# Patient Record
Sex: Female | Born: 1970 | Race: White | Hispanic: No | Marital: Single | Smoking: Never smoker
Health system: Southern US, Community
[De-identification: ages and names within clinical notes are randomized; demographics above are authoritative.]

## PROBLEM LIST (undated history)

## (undated) DIAGNOSIS — K219 Gastro-esophageal reflux disease without esophagitis: Secondary | ICD-10-CM

## (undated) DIAGNOSIS — J309 Allergic rhinitis, unspecified: Secondary | ICD-10-CM

## (undated) HISTORY — PX: KNEE SURGERY: SHX244

## (undated) HISTORY — PX: IUD REMOVAL: SHX5392

## (undated) HISTORY — DX: Allergic rhinitis, unspecified: J30.9

## (undated) HISTORY — DX: Gastro-esophageal reflux disease without esophagitis: K21.9

## (undated) HISTORY — PX: OTHER SURGICAL HISTORY: SHX169

---

## 2008-08-29 DIAGNOSIS — E7800 Pure hypercholesterolemia, unspecified: Secondary | ICD-10-CM | POA: Insufficient documentation

## 2008-08-29 DIAGNOSIS — J309 Allergic rhinitis, unspecified: Secondary | ICD-10-CM | POA: Insufficient documentation

## 2009-02-09 ENCOUNTER — Ambulatory Visit: Payer: Self-pay | Admitting: Primary Care

## 2009-08-29 ENCOUNTER — Encounter: Payer: Self-pay | Admitting: Gastroenterology

## 2009-08-29 ENCOUNTER — Ambulatory Visit: Payer: Self-pay | Admitting: Primary Care

## 2009-08-29 NOTE — Progress Notes (Signed)
Reason For Visit   Jasmine Nunez is here for evaluation of possible sinus infection.  VSwain, MA.  HPI   Jasmine Nunez is a 39 year old woman who c/o headache and yellow nasal discharge.    Feels fatigued.  Tried Allegra w/o relifef.  Sympotms for 1 week.  H/o   sinusitis but not for a while.  Both of her children have been sick with   cold symptoms.  Allergies   Latex-asked/denied  No Known Drug Allergy.  Current Meds   ** Medication reconciliation completed. **.  Multiple Vitamin Tablet;TAKE 1 TABLET DAILY.; RPT  Fish Oil 1000 MG Capsule;TAKE 1 CAPSULE DAILY.; RPT.  Active Problems   Allergic Rhinitis (477.9)  Essential Hypercholesterolemia (272.0).  Vital Signs   Recorded by vswain on 29 Aug 2009 10:11 AM  Temp: 98.3 F.  Physical Exam   General: Well-nourished well-developed in no acute distress.  HEENT: Oropharynx shows cobblestoning without exudate or tonsillar   enlargement.  Both tympanic membranes show normal landmarks.  No sinus   tenderness to palpation Neck: No anterior cervical lymphadenopathy.  Lungs: Clear to auscultation bilaterally.  Orders   Fluticasone Propionate 50 MCG/ACT Suspension;USE 2 SPRAYS IN EACH NOSTRIL   ONCE DAILY; Qty1; R11; Rx.  Amoxicillin 875 MG Tablet;TAKE 1 TABLET EVERY 12 HOURS DAILY; Qty20; R0; Rx.  Plan   Postnasal drip with sinusitis: Prescribed Flonase for treatment of the   postnasal drip which also will hopefully help the sinusitis.  Also advised   to try nasal saline.  If no improvement in the next couple of days, the   patient can fill the prescription provided for amoxicillin.  Signature   Electronically signed by: Inocencio Homes  MD Attend.; 08/29/2009 12:32   PM EST; Chartered loss adjuster.

## 2009-10-15 ENCOUNTER — Ambulatory Visit: Payer: Self-pay | Admitting: Primary Care

## 2009-10-15 ENCOUNTER — Ambulatory Visit
Admit: 2009-10-15 | Discharge: 2009-10-15 | Disposition: A | Discharge status: Home / Self Care | Payer: Self-pay | Source: Ambulatory Visit | Attending: Primary Care | Admitting: Primary Care

## 2009-10-15 LAB — CBC AND DIFFERENTIAL
Baso # K/uL: 0.1 THOU/uL (ref 0.0–0.1)
Basophil %: 0.6 % (ref 0.1–1.2)
Eos # K/uL: 0.2 THOU/uL (ref 0.0–0.4)
Eosinophil %: 2.5 % (ref 0.7–5.8)
Hematocrit: 42 % (ref 34–45)
Hemoglobin: 13.8 g/dL (ref 11.2–15.7)
Lymph # K/uL: 3.3 THOU/uL (ref 1.2–3.7)
Lymphocyte %: 37.3 % (ref 19.3–51.7)
MCV: 84 fL (ref 79–95)
Mono # K/uL: 0.6 THOU/uL (ref 0.2–0.9)
Monocyte %: 6.5 % (ref 4.7–12.5)
Neut # K/uL: 4.7 THOU/uL (ref 1.6–6.1)
Platelets: 258 THOU/uL (ref 160–370)
RBC: 5 MIL/uL (ref 3.9–5.2)
RDW: 13.8 % (ref 11.7–14.4)
Seg Neut %: 53.1 % (ref 34.0–71.1)
WBC: 8.9 THOU/uL (ref 4.0–10.0)

## 2009-10-15 LAB — COMPREHENSIVE METABOLIC PANEL
ALT: 16 U/L (ref 0–35)
AST: 28 U/L (ref 0–35)
Albumin: 4.7 g/dL (ref 3.5–5.2)
Alk Phos: 78 U/L (ref 35–105)
Anion Gap: 9 (ref 7–16)
Bilirubin,Total: 0.3 mg/dL (ref 0.0–1.2)
CO2: 29 mmol/L — ABNORMAL HIGH (ref 20–28)
Calcium: 9.5 mg/dL (ref 8.8–10.2)
Chloride: 101 mmol/L (ref 96–108)
Creatinine: 0.65 mg/dL (ref 0.51–0.95)
GFR,Black: >59 *
GFR,Caucasian: >59 *
Glucose: 98 mg/dL (ref 74–106)
Lab: 17 mg/dL (ref 6–20)
Potassium: 4.9 mmol/L (ref 3.3–5.1)
Sodium: 139 mmol/L (ref 133–145)
Total Protein: 7.6 g/dL (ref 6.3–7.7)

## 2009-10-15 LAB — IGA: IgA: 211 mg/dL (ref 70–400)

## 2009-10-15 LAB — TSH: TSH: 1.54 u[IU]/mL (ref 0.27–4.20)

## 2009-10-15 NOTE — Progress Notes (Signed)
Reason For Visit   Jasmine Nunez is here for a follow up visit.  VSwain, MA.  HPI   Jasmine Nunez is a 39 yo woman who comes in today with c/o feeling achy, headaches   and CP.     She says she feels achy all over.  Sleeps  8 hours and doesn't feel rested.    At times, she feels like she has a normal amt of energy.  She is usually a   very active person.  Knees and feet feel achy.  Has had this before, but a   little more frequent.  No rash.  No swelling of the joints.  Hasn't noticed   it being worse in the AM.  Takes Advil which helps.     Has been getting bad headaches.  Dull pain.  Vision feels funny along with   slight nausea.  H/A has lasted for 3 days.  Took Excedrin Migraine which   helped.  Rarely gets headaches.  Her left eye was twitching for weeks.       Has had pain and heaviness in her chest and heaviness in her left arm.  Not   assoc'd with stress.  Mom has CAD.  Usually lasts several hours.  No GERD   symptoms.  Not assoc'd with activity.  No symptoms with cardio exercise.       doesn't feel depressed but doesn't have energy to get anything done and   feels overwhelmed.  Sex drive has been decreased since her daughter was   born.  She and her husband have been going to counseling since December for   some help with managing arguments.  things are much better between them.  Allergies   Latex-asked/denied  No Known Drug Allergy.  Current Meds   ** Medication reconciliation completed and patient declined printed list.   **.  Multiple Vitamin Tablet;TAKE 1 TABLET DAILY.; RPT  Fish Oil 1000 MG Capsule;TAKE 1 CAPSULE DAILY.; RPT  Fluticasone Propionate 50 MCG/ACT Suspension;USE 2 SPRAYS IN EACH NOSTRIL   ONCE DAILY; Rx.  Active Problems   Allergic Rhinitis (477.9)  Essential Hypercholesterolemia (272.0).  POCT   EKG:  NSR rate 78.  No acute ST T wave changes.  Normal intervals.  Vital Signs   Recorded by vswain on 15 Oct 2009 02:51 PM  BP:112/58,  RUE,  Sitting,   Weight: 133 lb.  Physical Exam   GENERAL APPEARANCE:  Appears stated age, well appearing, NAD     LUNGS: Clear to auscultation   HEART: Normal S1,S2 without murmurs  ABDOMEN: NABS, soft, non-tender, without hepato-splenomegaly  EXTREMITIES: Without clubbing, cyanosis, or edema  NEUROLOGIC: Alert and oriented x3.  Orders   Omeprazole 20 MG Capsule Delayed Release;take 1 capsule by mouth once   daily; Qty30; R0; Rx.  Plan   CP:  likely GERD or anxeity, but will order stress echo given FH of CAD.    Rx omeprazole to help with possible reflux     Fatigue, polyarthralgia:  check labs     Headache:  consistent with migraines.  Discussed importance of stress   reduction.  Advised to take Excedrin Migraine at first sign of headache     LABS needed today:   --CBC    --Chem 14    --TSH    Other: celiac screen, RF, ANA, Vit D  FOLLOW UP:   6  Weeks   .  Signature   Electronically signed by: Inocencio Homes  MD Attend.; 10/15/2009 4:09  PM EST; Chartered loss adjuster.

## 2009-10-16 LAB — RHEUMATOID FACTOR,SCREEN: Rheumatoid Factor: <10 IU/mL

## 2009-10-16 LAB — ANTINUCLEAR ANTIBODY SCREEN: ANA Screen: NEGATIVE

## 2009-10-16 LAB — LYME IGG/IGM AB: Lyme AB Screen: NEGATIVE

## 2009-10-16 LAB — T4, FREE: Free T4: 1.2 ng/dL (ref 0.9–1.7)

## 2009-10-17 LAB — TISSUE TRANSGLUT,IGA: tTG,IgA: 7.8 AB/Units (ref 0.0–19.9)

## 2009-10-18 LAB — VITAMIN D
25-OH VIT D2: <4 ng/mL
25-OH VIT D3: 36 ng/mL
25-OH Vit Total: 36 ng/mL (ref 30–80)

## 2009-10-24 ENCOUNTER — Other Ambulatory Visit: Payer: Self-pay

## 2009-11-01 ENCOUNTER — Other Ambulatory Visit: Payer: Self-pay | Admitting: Gastroenterology

## 2009-11-01 ENCOUNTER — Ambulatory Visit: Payer: Self-pay

## 2009-12-12 ENCOUNTER — Ambulatory Visit
Admit: 2009-12-12 | Discharge: 2009-12-12 | Disposition: A | Discharge status: Home / Self Care | Payer: Self-pay | Source: Ambulatory Visit | Attending: Primary Care | Admitting: Primary Care

## 2009-12-12 LAB — LIPID PANEL
Chol/HDL Ratio: 2.8
Cholesterol: 193 mg/dL
HDL: 69 mg/dL
LDL Calculated: 106 mg/dL
Non HDL Cholesterol: 124 mg/dL
Triglycerides: 88 mg/dL

## 2009-12-12 LAB — ALT: ALT: 19 U/L (ref 0–35)

## 2009-12-12 LAB — AST: AST: 25 U/L (ref 0–35)

## 2009-12-19 ENCOUNTER — Ambulatory Visit: Payer: Self-pay | Admitting: Primary Care

## 2009-12-19 NOTE — Progress Notes (Signed)
Reason For Visit   Jasmine Nunez is here for a follow up visit.  VSwain, MA.  HPI   Jasmine Nunez is a 39 year old woman who comes in today for her achiness and chest   pain.     At the last visit, I ordered a stress echocardiogram which was normal.  She   still gets pain in left side of chest.  Omeprazole has really helped, but   she did run out.  She did get pain when she was on the omeprazole.    Appetite is good.  No blood in stool.  Sometimes has epigastric pain.    Feels a choking sensation.       Achiness and fatigue has improved for the most part.  Her level of anxiety   has improved as well since her husband has been treated for ADHD.  She   finds that he is much easier to communicate with since he has been treated.  Allergies   Latex-asked/denied  No Known Drug Allergy.  Current Meds   ** Medication reconciliation completed and patient declined printed list.   **.  Multiple Vitamin Tablet;TAKE 1 TABLET DAILY.; RPT  Fish Oil 1000 MG Capsule;TAKE 1 CAPSULE DAILY.; RPT  Loratadine 10 MG Tablet;TAKE 1 TABLET DAILY AS NEEDED.; RPT.  Active Problems   Allergic Rhinitis (477.9)  Essential Hypercholesterolemia (272.0).  Vital Signs   Recorded by vswain on 19 Dec 2009 08:50 AM  BP:98/60,  RUE,  Sitting,   Height: 62 in, Weight: 134 lb, BMI: 24.5 kg/m2.  Physical Exam   General: Well-nourished well-developed in no acute distress.  Abdomen: Soft, nontender, nondistended.  Orders   Renew Omeprazole 20 MG Capsule Delayed Release;take 1 capsule by mouth once   daily; Qty30; R3; Rx.  Plan   Dyspepsia: This is persistent despite the omeprazole.  Refill the   omeprazole, but referred her to do Dr. Aquilla Hacker from gastroenterology for an   endoscopy.     Assessment: Gave patient flu vaccine.  Signature   Electronically signed by: Inocencio Homes  MD Attend.; 12/19/2009 10:54   AM EST; Chartered loss adjuster.

## 2010-03-08 ENCOUNTER — Ambulatory Visit
Admit: 2010-03-08 | Discharge: 2010-03-08 | Disposition: A | Discharge status: Home / Self Care | Payer: Self-pay | Source: Ambulatory Visit | Attending: Obstetrics | Admitting: Obstetrics

## 2010-03-08 LAB — COMPREHENSIVE METABOLIC PANEL
ALT: 19 U/L (ref 0–35)
AST: 25 U/L (ref 0–35)
Albumin: 4.3 g/dL (ref 3.5–5.2)
Alk Phos: 68 U/L (ref 35–105)
Anion Gap: 9 (ref 7–16)
Bilirubin,Total: 0.3 mg/dL (ref 0.0–1.2)
CO2: 28 mmol/L (ref 20–28)
Calcium: 9 mg/dL (ref 8.8–10.2)
Chloride: 102 mmol/L (ref 96–108)
Creatinine: 0.53 mg/dL (ref 0.51–0.95)
GFR,Black: >59 *
GFR,Caucasian: >59 *
Glucose: 63 mg/dL — ABNORMAL LOW (ref 74–106)
Lab: 15 mg/dL (ref 6–20)
Potassium: 4 mmol/L (ref 3.3–5.1)
Sodium: 139 mmol/L (ref 133–145)
Total Protein: 6.8 g/dL (ref 6.3–7.7)

## 2010-03-08 LAB — CBC AND DIFFERENTIAL
Baso # K/uL: 0 THOU/uL (ref 0.0–0.1)
Basophil %: 0.4 % (ref 0.1–1.2)
Eos # K/uL: 0.2 THOU/uL (ref 0.0–0.4)
Eosinophil %: 2.2 % (ref 0.7–5.8)
Hematocrit: 40 % (ref 34–45)
Hemoglobin: 13 g/dL (ref 11.2–15.7)
Lymph # K/uL: 2.6 THOU/uL (ref 1.2–3.7)
Lymphocyte %: 33.5 % (ref 19.3–51.7)
MCV: 84 fL (ref 79–95)
Mono # K/uL: 0.6 THOU/uL (ref 0.2–0.9)
Monocyte %: 7.8 % (ref 4.7–12.5)
Neut # K/uL: 4.4 THOU/uL (ref 1.6–6.1)
Platelets: 224 THOU/uL (ref 160–370)
RBC: 4.7 MIL/uL (ref 3.9–5.2)
RDW: 13.4 % (ref 11.7–14.4)
Seg Neut %: 56.1 % (ref 34.0–71.1)
WBC: 7.8 THOU/uL (ref 4.0–10.0)

## 2010-03-08 LAB — TSH: TSH: 1.62 u[IU]/mL (ref 0.27–4.20)

## 2010-03-08 LAB — PROLACTIN: Prolactin: 5.5 ng/mL

## 2010-03-08 LAB — LUTEINIZING HORMONE: LH: 7.3 m[IU]/mL

## 2010-03-08 LAB — ESTRADIOL: Estradiol: 88 pg/mL

## 2010-03-08 LAB — FOLLICLE STIMULATING HORMONE: FSH: 2.9 m[IU]/mL

## 2010-03-11 DIAGNOSIS — E559 Vitamin D deficiency, unspecified: Secondary | ICD-10-CM | POA: Insufficient documentation

## 2010-03-11 LAB — VITAMIN D
25-OH VIT D2: <4 ng/mL
25-OH VIT D3: 25 ng/mL
25-OH Vit Total: 25 ng/mL — ABNORMAL LOW (ref 30–80)

## 2010-04-09 NOTE — Miscellaneous (Unsigned)
 Continuity of Care Record  Created: todo  From: ,   From:   From: [employer], EHR v10.2.7.53  To: Steven, Haylyn  Purpose: Patient Use;       Problems  Diagnosis: Allergic Rhinitis (477.9)   Diagnosis: Essential Hypercholesterolemia (272.0)   Diagnosis: Vitamin D Deficiency 11 Mar 2010 (268.9)     Family History  Maternal history of Coronary Artery Disease    Alerts  Allergy - Latex-asked/denied   Allergy - No Known Drug Allergy     Medications  Loratadine 10 MG Tablet; TAKE 1 TABLET DAILY AS NEEDED. ; RPT   Multiple Vitamin Tablet; TAKE 1 TABLET DAILY. ; RPT   Ofloxacin 0.3 % Solution; INSTILL 1 DROP 4 TIMES DAILY into affected eye   for 3 to 5 days ; Rx   Omeprazole 20 MG Capsule Delayed Release; take one capsule by mouth every   day ; Rx     Immunizations  Tdap (Adacel)   Influenza   Influenza

## 2010-06-03 ENCOUNTER — Ambulatory Visit: Payer: Self-pay | Admitting: Primary Care

## 2010-06-03 ENCOUNTER — Encounter: Payer: Self-pay | Admitting: Primary Care

## 2010-06-03 NOTE — Progress Notes (Signed)
 Reason For Visit   C/o poss pink eye and sinus infection  ehandslpn.  HPI   40 year old woman who wears contact lenses presents for irritation of the   left eye.  Patient reports a probable conjunctivitis.  She has not been using any over-the-counter drops.  He has had mucoid   discharge.  She denies any eye pain or visual disturbances although she   reports some mild blurring of the vision..  This may be secondary to her   secretions.  No preceding viral illness.   She is currently wearing glasses and has stopped wearing her contacts.   .  Allergies   Latex-asked/denied  No Known Drug Allergy.  Current Meds   ** Medication reconciliation completed and updated list printed for the   patient. **.  Multiple Vitamin Tablet;TAKE 1 TABLET DAILY.; RPT  Loratadine 10 MG Tablet;TAKE 1 TABLET DAILY AS NEEDED.; RPT  Omeprazole 20 MG Capsule Delayed Release;take 1 capsule by mouth once   daily; Rx.  Active Problems   Allergic Rhinitis (477.9)  Essential Hypercholesterolemia (272.0)  Vitamin D Deficiency 11 Mar 2010 (268.9).  Personal Hx   Medications reviewed in [employer]   .  Vital Signs   Recorded by [facility] on 03 Jun 2010 09:38 AM  BP:100/60,   HR: 86 b/min,   Temp: 98.6 F,   Height: 62 in, Weight: 135 lb, BMI: 24.7 kg/m2.  Physical Exam   GENERAL APPEARANCE: no acute distress  HEENT: left eye with conjunctival injection.  No proptosis.  Pupils   reactive.  Funduscopic exam is normal.  Extraocular muscles intact.  Right   eye is normal.  LUNGS: Clear to auscultation   HEART: regular rhythm  .  Assessment   1. Conjunctivitis, acute OS   - Rx ofloxacin solution as directed  - see eye care professional of no improvement in 48 hours  - no contacts until symptoms resolve.  Signature   Electronically signed by: Royden Purl  D.O.; 06/03/2010 10:15 AM EST.

## 2010-06-04 ENCOUNTER — Ambulatory Visit: Payer: Self-pay | Admitting: Primary Care

## 2010-06-04 ENCOUNTER — Ambulatory Visit: Payer: Self-pay | Admitting: Ophthalmology

## 2010-06-07 ENCOUNTER — Ambulatory Visit: Payer: Self-pay | Admitting: Ophthalmology

## 2010-06-24 ENCOUNTER — Ambulatory Visit: Payer: Self-pay | Admitting: Ophthalmology

## 2010-06-27 ENCOUNTER — Ambulatory Visit
Admit: 2010-06-27 | Discharge: 2010-06-27 | Disposition: A | Discharge status: Home / Self Care | Payer: Self-pay | Source: Ambulatory Visit | Attending: Gastroenterology | Admitting: Gastroenterology

## 2010-06-27 LAB — IGA: IgA: 199 mg/dL (ref 70–400)

## 2010-06-28 LAB — TISSUE TRANSGLUT,IGA: tTG,IgA: 4.9 AB/Units (ref 0.0–19.9)

## 2010-07-15 ENCOUNTER — Other Ambulatory Visit: Payer: Self-pay | Admitting: Obstetrics & Gynecology

## 2010-07-15 DIAGNOSIS — R928 Other abnormal and inconclusive findings on diagnostic imaging of breast: Secondary | ICD-10-CM

## 2010-07-15 DIAGNOSIS — N63 Unspecified lump in unspecified breast: Secondary | ICD-10-CM

## 2010-07-18 ENCOUNTER — Ambulatory Visit
Admit: 2010-07-18 | Discharge: 2010-07-18 | Disposition: A | Discharge status: Home / Self Care | Payer: Self-pay | Source: Ambulatory Visit | Attending: Obstetrics & Gynecology | Admitting: Obstetrics & Gynecology

## 2010-10-16 ENCOUNTER — Ambulatory Visit: Payer: Self-pay | Admitting: Primary Care

## 2010-10-16 VITALS — BP 100/62 | HR 64 | Temp 98.8°F | Wt 132.4 lb

## 2010-10-16 DIAGNOSIS — R059 Cough, unspecified: Secondary | ICD-10-CM

## 2010-10-16 DIAGNOSIS — J309 Allergic rhinitis, unspecified: Secondary | ICD-10-CM

## 2010-10-16 MED ORDER — GUAIFENESIN-CODEINE 100-10 MG/5ML PO SYRP *I*
5.0000 mL | ORAL_SOLUTION | Freq: Three times a day (TID) | ORAL | Status: DC | PRN
Start: 2010-10-16 — End: 2010-10-25

## 2010-10-16 NOTE — Progress Notes (Signed)
Subjective:     Patient ID: Jasmine Nunez is a 40 y.o. female.    HPI  Jasmine Nunez is a 40 yo woman who c/o cough and congestion.  Lost voice 2 weeks ago.  Coughing for past 2 weeks. Tried zyrtec.  Feels really tired throughout the day.  Still pretty congested.  Using a nasal steroid with a little relief.      Patient's medications, allergies, past medical, surgical, social and family histories were reviewed and updated as appropriate.    Review of Systems          Objective:   Physical Exam   Constitutional: She appears well-developed and well-nourished. She appears ill.   HENT:   Right Ear: Tympanic membrane normal.   Left Ear: Tympanic membrane normal.   Nose: Mucosal edema present.   Mouth/Throat: No oropharyngeal exudate.   Pulmonary/Chest: Breath sounds normal.   Lymphadenopathy:     She has no cervical adenopathy.             Assessment:      Sinusitis:  Likely viral with persistent cough        Plan:        rx guiafenesin with codeine to help suppress cough  Advised to use nasal saline

## 2010-10-23 ENCOUNTER — Telehealth: Payer: Self-pay | Admitting: Primary Care

## 2010-10-23 NOTE — Telephone Encounter (Signed)
Spoke with patient.    States that she has been having a headache on/off in the morning for the past 3-4 days.  She states that she has been waking with tension at the base of her neck... Seems to be spreading to her head and eventually to the left side of her head and left eye.  She states that she has been taking Excedrin migraine #2 pills in morning.  She states that she feels fine throughout the day but then wakes again with this "awful headache."  She states that this morning she developed dizziness and nausea.  She states that she has never been prescribed anything for migraines before.  We discussed that she could possibly be having rebound headaches from increased use of Excedrin.  She will try Ibuprofen 600mg  at bedtime tonight and follow up tomorrow if no improvement.  Advised her to take this with food and increase hydration.  Also discussed with patient that she could try taking Ibuprofen 600mg  TID tomorrow if needed to to help with headache and if she wasn't feeling better on Friday she should call in the morning for an appointment.  She is aware she can call on-call physician if needed.

## 2010-10-23 NOTE — Telephone Encounter (Signed)
PT CALLING HAS A VERY BAD MIGRAINE  LIKE TO SPEAK WITH A NURSE ABOUT IT.  PLZ CALL HER ASAP  THNAK YOU

## 2010-10-25 ENCOUNTER — Encounter: Payer: Self-pay | Admitting: Primary Care

## 2010-10-25 ENCOUNTER — Ambulatory Visit: Payer: Self-pay | Admitting: Primary Care

## 2010-10-25 ENCOUNTER — Telehealth: Payer: Self-pay | Admitting: Primary Care

## 2010-10-25 DIAGNOSIS — J309 Allergic rhinitis, unspecified: Secondary | ICD-10-CM

## 2010-10-25 DIAGNOSIS — G43719 Chronic migraine without aura, intractable, without status migrainosus: Secondary | ICD-10-CM | POA: Insufficient documentation

## 2010-10-25 DIAGNOSIS — G43909 Migraine, unspecified, not intractable, without status migrainosus: Secondary | ICD-10-CM

## 2010-10-25 LAB — CBC AND DIFFERENTIAL
Baso # K/uL: 0 10*3/uL (ref 0.0–0.1)
Basophil %: 0.1 % (ref 0.1–1.2)
Eos # K/uL: 0 10*3/uL (ref 0.0–0.4)
Eosinophil %: 0.1 % — ABNORMAL LOW (ref 0.7–5.8)
Hematocrit: 43 % (ref 34–45)
Hemoglobin: 14.5 g/dL (ref 11.2–15.7)
Lymph # K/uL: 1.3 10*3/uL (ref 1.2–3.7)
Lymphocyte %: 9.6 % — ABNORMAL LOW (ref 19.3–51.7)
MCV: 81 fL (ref 79–95)
Mono # K/uL: 0.2 10*3/uL (ref 0.2–0.9)
Monocyte %: 1.7 % — ABNORMAL LOW (ref 4.7–12.5)
Neut # K/uL: 12.1 10*3/uL — ABNORMAL HIGH (ref 1.6–6.1)
Platelets: 280 10*3/uL (ref 160–370)
RBC: 5.3 MIL/uL — ABNORMAL HIGH (ref 3.9–5.2)
RDW: 13.3 % (ref 11.7–14.4)
Seg Neut %: 88.5 % — ABNORMAL HIGH (ref 34.0–71.1)
WBC: 13.6 10*3/uL — ABNORMAL HIGH (ref 4.0–10.0)

## 2010-10-25 LAB — BASIC METABOLIC PANEL
Anion Gap: 11 (ref 7–16)
CO2: 26 mmol/L (ref 20–28)
Calcium: 9.6 mg/dL (ref 8.8–10.2)
Chloride: 101 mmol/L (ref 96–108)
Creatinine: 0.55 mg/dL (ref 0.51–0.95)
GFR,Black: >59 *
GFR,Caucasian: >59 *
Glucose: 128 mg/dL — ABNORMAL HIGH (ref 60–99)
Lab: 16 mg/dL (ref 6–20)
Potassium: 4.2 mmol/L (ref 3.3–5.1)
Sodium: 138 mmol/L (ref 133–145)

## 2010-10-25 LAB — T4, FREE: Free T4: 1.3 ng/dL (ref 0.9–1.7)

## 2010-10-25 LAB — TSH: TSH: 0.62 u[IU]/mL (ref 0.27–4.20)

## 2010-10-25 MED ORDER — AMITRIPTYLINE HCL 10 MG PO TABS *I*
10.0000 mg | ORAL_TABLET | Freq: Every evening | ORAL | Status: DC
Start: 2010-10-25 — End: 2010-10-29

## 2010-10-25 MED ORDER — METHYLPREDNISOLONE 4 MG PO TABS *I*
ORAL_TABLET | ORAL | Status: DC
Start: 2010-10-25 — End: 2010-10-31

## 2010-10-25 MED ORDER — EPINEPHRINE 0.3 MG/0.3ML IJ DEVI *I*
0.3000 mg | Freq: Once | INTRAMUSCULAR | Status: AC
Start: 2010-10-25 — End: 2010-10-25

## 2010-10-25 NOTE — Progress Notes (Signed)
Subjective:     Patient ID: Jasmine Nunez is a 40 y.o. female.    HPI  Jasmine Nunez is a 40 year old woman who comes in today with the complaint of a persistent headache and allergic reaction.  Developed sudden trouble with abd pain and diarrhea last night.  Happened about an hour after eating dinner.  Dinner was ribs, corn, melon.  Had oatmeal and homemade sweet tea- made with mint and lemon.  She had been drinking this batch of tea for the past few days.  Within an hour, she had developed a very red face, hive on eye.  Took 4 tsps of benadryl and started rx for prednisone last night.  Benadryl was effective immediately for itching in throat.     Has had a headache for the past 3 days.  Has been feeling nauseous and dizzy all week.  Has been feeling sound and light sensitive.  Has been having more headaches over past 6-8 months.  Happening 4-5 times per month.  H/a is at the back of the head or over the left eye.  Left eye twitches when this happens.  She's never really had migraines before.  For birth control, she uses an IUD which does not contain hormones        Jasmine Nunez has Essential Hypercholesterolemia; Allergic Rhinitis; Vitamin D Deficiency; and Migraine on her problem list.  Current Outpatient Prescriptions on File Prior to Visit   Medication Sig Dispense Refill   . FISH OIL Take 1 capsule by mouth daily as needed           . cholecalciferol (VITAMIN D) 1000 UNIT tablet Take 1,000 Units by mouth daily           . cetirizine (ZYRTEC) 10 MG tablet Take 10 mg by mouth daily           . omeprazole (PRILOSEC) 20 MG capsule take one capsule by mouth every day  30  1   . Multiple Vitamin tablet TAKE 1 TABLET DAILY.    0     Jasmine Nunez is allergic to no known drug allergy and no known latex allergy.            Review of Systems          Objective: BP 120/82  Pulse 91  Wt 60.51 kg (133 lb 6.4 oz)     Physical Exam   Constitutional: She is oriented to person, place, and time. She appears well-developed and well-nourished. She  appears distressed (mild).   HENT:   Head: Normocephalic and atraumatic.   Right Ear: Tympanic membrane normal.   Left Ear: Tympanic membrane normal.   Mouth/Throat: Oropharynx is clear and moist.   Cardiovascular: Normal rate, regular rhythm, S1 normal and S2 normal.    No murmur heard.  Pulmonary/Chest: Effort normal and breath sounds normal.   Abdominal: Soft. There is no tenderness.   Neurological: She is alert and oriented to person, place, and time. She has normal strength and normal reflexes. No cranial nerve deficit or sensory deficit.             Assessment:      Allergic reaction: This mostly likely represents a food allergy.  She is clinically improved at this point    Headache: Most likely a tension headache, but does have features of a migraine headache given the photo and phonophobia.  This is a new onset headache.        Plan:    the plan is  for referral to allergy to determine which foods she is allergic to.  Provided patient with a prescription for an EpiPen should the symptoms recur.  Also advised her to take Benadryl in the event that symptoms recur as well as prednisone.    Ordered a head CT to rule out an intracranial mass in this new-onset headache.  Prescribed amitriptyline to help prevent headaches.  Discussed common triggers for migraine including certain foods, stress, insomnia.  Followup in 6 weeks to reassess.    Orders Placed This Encounter   Procedures   . CT head with contrast   . CT head without contrast   . T4, free   . TSH   . Basic metabolic panel   . CBC and differential   . AMB REFERRAL TO ALLERGY

## 2010-10-25 NOTE — Telephone Encounter (Signed)
Left patient detailed message, asked them to call back if they have any questions.

## 2010-10-25 NOTE — Telephone Encounter (Signed)
Message copied by Lennie Hummer on Fri Oct 25, 2010  1:18 PM  ------       Message from: Inocencio Homes       Created: Fri Oct 25, 2010  1:16 PM         route

## 2010-10-28 ENCOUNTER — Telehealth: Payer: Self-pay | Admitting: Primary Care

## 2010-10-28 NOTE — Telephone Encounter (Signed)
Left patient detailed message, asked them to call back if they have any questions.

## 2010-10-28 NOTE — Telephone Encounter (Signed)
Message copied by Lennie Hummer on Mon Oct 28, 2010  9:28 AM  ------       Message from: Inocencio Homes       Created: Fri Oct 25, 2010  6:06 PM         Please let her know the CT was normal

## 2010-10-29 ENCOUNTER — Encounter: Payer: Self-pay | Admitting: Sleep Medicine

## 2010-10-29 ENCOUNTER — Ambulatory Visit: Payer: Self-pay | Admitting: Sleep Medicine

## 2010-10-29 VITALS — BP 112/65 | HR 86 | Temp 98.4°F | Ht 62.0 in | Wt 132.0 lb

## 2010-10-29 DIAGNOSIS — J309 Allergic rhinitis, unspecified: Secondary | ICD-10-CM

## 2010-10-29 DIAGNOSIS — Z91018 Allergy to other foods: Secondary | ICD-10-CM

## 2010-10-29 DIAGNOSIS — T782XXA Anaphylactic shock, unspecified, initial encounter: Secondary | ICD-10-CM

## 2010-10-29 DIAGNOSIS — T781XXA Other adverse food reactions, not elsewhere classified, initial encounter: Secondary | ICD-10-CM

## 2010-10-29 MED ORDER — BECLOMETHASONE DIPROPIONATE 80 MCG/ACT IN AERS *I*
INHALATION_SPRAY | RESPIRATORY_TRACT | Status: DC
Start: 2010-10-29 — End: 2011-02-14

## 2010-10-29 NOTE — Patient Instructions (Signed)
-   AVOID all melons (canteloupe, honeydew, watermelon, muskmelon, etc).     - Make sure you have your EpiPen present at all times to use in the event of a severe allergic reaction (hives all over, itchy throat with throat tightening or swelling, vomiting/diarrhea, abdominal pain, trouble breathing or speaking). If you have another reaction like you had last week, use the EpiPen and call 911 right away.     - For the allergy skin testing, bring the BBQ sauce and the canteloupe so we can test you to those.     - For the nasal and eye symptoms, and the postnasal drip, try saline rinses 2-3 times per week, and when you do the rinses, do them before starting the nasal Qvar.     - Start Qvar 80, 1 puff in each nostril twice a day, using a baby bottle nipple with the tip cut off as a nasal applicator.     - Avoid OTC antihistamines (Allegra, Claritin, Benadryl, Zyrtec, etc) 4 full days before the skin testing appointment.

## 2010-10-29 NOTE — Progress Notes (Signed)
Dear Dr. Porfirio Mylar,    Thank you very much for requesting a consultation for your patient,  Jasmine Nunez, who presented to the [facility] for a new patient evaluation today.  As you know, Jasmine Nunez is a 40 y.o. female with a past medical history significant for GERD, migraine headaches, and allergic rhinitis. She presents for evaluation for a possible food allergy in the setting of a recent episode of anaphylaxis. Five days ago, Jasmine Nunez states about an hour after she had dinner, while she was putting her son to bed, she developed severe abdominal cramping and pain. She ran to the bathroom, and began to experience generalized itching that progressed to oral itching and throat tightening. She had diarrhea and noticed hives on her face, and her entire face and neck were very red. She also felt short of breath. She spoke to the on-call physician for her practice who recommended she take 50 mg of Benadryl, and called in a prescription for methylprednisolone. The symptoms gradually abated and she fell asleep, and awoke the next morning feeling much better, though she states her "throat still felt funny for a while" the next day. She now carries an EpiPen at all times and has almost finished the methylprednisolone taper.     About one hour prior to the onset of these symptoms, Jasmine Nunez had eaten dinner consisting of BBQ ribs, corn on the cob (steamed with butter), and muskmelon (English canteloupe) for desert. She reports none of these foods were unusual for her, and there were no other unusual ingestions (no new medications or other foods) or exposures. Of note, she does report she has had isolated oral itching with bananas and watermelon in the past, and once experienced abdominal pain after eating watermelon.     Jasmine Nunez has seasonal and perennial allergy symptoms, particularly in the spring and fall. She has never had a formal allergy evaluation, and manages her  symptoms with cetirizine as needed. She has used Rhinocort in the past, but did not like the liquid spray or the smell of the spray. She does not have a history of asthma or related symptoms. She has no known trouble with stinging insects, and no known medication allergies. She has not had any recurrent bacterial or fungal infections, infections of deep tissue or infections caused by unusual organisms.     Past Med Hx:  Past Medical History   Diagnosis Date   . GERD (gastroesophageal reflux disease)    . History of migraine headaches    . Allergic rhinitis      Past Surg Hx:   Past Surgical History   Procedure Date   . Converted procedure      [facility] Intrauterine Device (IUD) Removal Conversion Data    . Knee surgery 1986     for benign bony overgrowth      ROS:  Except as per HPI, 10 other systems were reviewed and were normal.    Fam Hx:  Family History   Problem Relation Age of Onset   . Conversion Other      [id number] Artery [id number]^Active^mid 50s   . Asthma Mother    . Asthma Sister    . Allergic rhinitis Sister    . No other contributory family history.     Environmental Hx:   Jasmine Nunez lives with her husband and two children (ages 2 and 4). They have no pets. They live in a house that  has mostly wall-to-wall carpeting. There are no known problems with mold or water damage in the home. She does not smoke, and drinks 1 alcoholic drink per week, and denies illicit drug use. She is a stay-at-home mom.      Meds:  Reviewed separately and discussed with the patient.    Physical Examination:  GEN: Alert and interactive, well-nourished, well appearing, NAD  VITALS: Reviewed separately  HEENT: A complete HEENT examination was done with details as follows: TMs pearly gray with normal landmarks, PERRL, EOMI, conjunctivae pink, sclerae with moderate injection, nasal mucosa is moderately edematous with enlarged inferior turbinates and narrowed nasal passages, oropharynx with cobblestoning and  visible mucous drainage  NECK: Supple with full ROM, no thyromegaly, no appreciable lymphadenopathy  LUNGS: Clear to auscultation bilaterally  HEART: Normal S1,S2 without murmurs  EXT: Without clubbing, cyanosis, or edema  SKIN: Clear, no lesions or rashes noted    Assessment:  Subrena Kobrin Crothers is a 40 y.o. female with likely seasonal and perennial allergic rhinoconjunctivitis who had a recent episode of anaphylaxis of unclear etiology. 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 In many individuals who have environmental allergies, fresh fruits and vegetables and certain nuts and spices can trigger an allergic reaction that causes oropharyngeal itching and tingling. In some of these individuals, pollen-food allergy syndrome , also called oral allergy syndrome, can cause itching and even swelling of the throat. This is due to cross-reactivity among some of the proteins that compose aeroallergens and some proteins in fruits and vegatables, so that the body reacts to both. In particular, individuals who are allergic to ragweed may also react to melons and bananas. Fewer than 10 percent of patients with allergies to fresh fruits and vegetables experience systemic symptoms, but in a review that included a total of 1361 individuals with PFAS, 1.7 percent experienced anaphylaxis to the fruit or vegetable, and 9% experience systemic symptoms outside of the GI tract. 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     Skin testing is not usually performed for several weeks after an episode of anaphylaxis because it has been observed that anaphylaxis can render the skin temporarily nonreactive. Thus, we will plan to have Mrs. Free return to our  office for allergy skin testing to the BBQ sauce, corn, beef or pork, and melons, as well as to environmental allergens, in the next 5-6 weeks.     Recommendations:  - For her ocular and nasal symptoms, Mrs. Arber will start Qvar 80 intranasally, since she expresses a dislike for the taste and sensation of a liquid nasal spray. The way to do this is to place a baby bottle nipple over the mouth piece of the Qvar (the very tip of the baby bottle nipple should be cut off to create a larger opening). Then 1 puff is applied in each nostril twice daily through the baby bottle nipple.     - She will also start nasal saline rinses 2-3 times per week, before applying the nasal Qvar.    - She will avoid melons entirely, and will carry the EpiPen at all times. The appropriate use and administration of self-injectable epinephrine was reviewed with Mrs. Kunka, and she  was able to demonstrate how  to administer the EpiPen appropriately using an EpiPen trainer device.     - I want to thank you again for the consultation. We will plan on following up in 6 weeks, after the skin testing, or sooner if needed. It was a pleasure seeing Mrs. Vetere today. If you have any questions or concerns, please do not hesitate to contact me at [phone].

## 2010-10-30 ENCOUNTER — Telehealth: Payer: Self-pay | Admitting: Primary Care

## 2010-10-30 DIAGNOSIS — R42 Dizziness and giddiness: Secondary | ICD-10-CM

## 2010-10-30 MED ORDER — MECLIZINE HCL 25 MG PO TABS *I*
25.0000 mg | ORAL_TABLET | Freq: Three times a day (TID) | ORAL | Status: DC | PRN
Start: 2010-10-30 — End: 2010-12-18

## 2010-10-30 NOTE — Telephone Encounter (Signed)
Patient is having a terrible bout of dizziness/nausea, it did subside but has started again. She was given a new inhaler yesterday, but the first episode started prior starting that. She is wondering could this be from the steroid she is using. (she took the last dose this morning). Being taking for the last 6 days. Please call her to advise.

## 2010-10-30 NOTE — Telephone Encounter (Signed)
Spoke to paitnet.  Upset stomahc since eating pizza 2 days ago.  Started to feel dizzy yest.  Very dizzy today and nauseous.  Had neg head CT last week.  Saw allergist and started nasal spray yest  Wondering if nasal spray has given her vertigo  rx meclizine.  Warned may make her fatigued  Referred to neuro given dizziness and new onset headaches  Advised to f/u if sx;s persist in 2 days

## 2010-10-31 ENCOUNTER — Ambulatory Visit: Payer: Self-pay | Admitting: Palliative Care

## 2010-10-31 ENCOUNTER — Telehealth: Payer: Self-pay

## 2010-10-31 ENCOUNTER — Telehealth: Payer: Self-pay | Admitting: Primary Care

## 2010-10-31 VITALS — BP 126/90 | HR 113 | Wt 132.0 lb

## 2010-10-31 DIAGNOSIS — D72829 Elevated white blood cell count, unspecified: Secondary | ICD-10-CM

## 2010-10-31 DIAGNOSIS — H832X9 Labyrinthine dysfunction, unspecified ear: Secondary | ICD-10-CM

## 2010-10-31 MED ORDER — METHYLPREDNISOLONE 4 MG PO TABS *I*
ORAL_TABLET | ORAL | Status: DC
Start: 2010-10-31 — End: 2010-12-04

## 2010-10-31 NOTE — Telephone Encounter (Signed)
Patient is coming in to see Dr. Marjo Bicker today at 3:40pm

## 2010-10-31 NOTE — Progress Notes (Signed)
Subjective:   Patient comes in for dizziness.    She was feeling her usual state of health until 1 week ago when she had a sudden onset allergic reaction with lip, facial and throat swelling, diffuse erythema of the skin, and hives.  She was started on benadryl and methylprednisolone, which helped.  2 days later, she starting have dizziness, like she might pass out, but also unsteady.  This has persisted but got worse yesterday.  Called and was prescribed meclizine yesterday by Dr. Porfirio Mylar.  It didn't seem to help much, so hasn't taken anything else in about 18 hours.    Today still with ongoing dizzy feeling:  Unsteady "like I'm drunk". Can't walk in a straight line, feels like she's in slow motion, speech feels slow, has to be careful with movements.   No headache.  Had CT negative last week.  No visual changes, hearing changes, ringing in ears, vertigo/spinning.  No numbness, tingling, weakness.  No fevers/chills.  Stomach feels "off", grumbling but no diarrhea.  Regular BM today.  No n/v.  +Viral infection last month with sinusitis  No family h/o vertigo, hypothyroidism, MS, cancer.     Patient Active Problem List   Diagnoses Code   . Essential Hypercholesterolemia 272.0   . Allergic Rhinitis 477.9   . Vitamin D Deficiency 268.9   . Migraine 346.90     Medications: reviewed medication, made appropriate updates, list printed for patient at discharge  Current Outpatient Prescriptions on File Prior to Visit   Medication Sig Dispense Refill   . epinephrine (EPIPEN) 0.3 MG/0.3ML DEVI Inject 0.3 mg into the muscle once as needed           . beclomethasone (QVAR) 80 MCG/ACT inhaler Shake well. Use 1 puff each nostril twice daily using a baby bottle nipple.  1 Inhaler  5   . FISH OIL Take 1 capsule by mouth daily as needed           . cholecalciferol (VITAMIN D) 1000 UNIT tablet Take 1,000 Units by mouth daily           . cetirizine (ZYRTEC) 10 MG tablet Take 10 mg by mouth daily           . omeprazole (PRILOSEC) 20  MG capsule take one capsule by mouth every day  30  1   . Multiple Vitamin tablet TAKE 1 TABLET DAILY.    0   . meclizine (ANTIVERT) 25 MG tablet Take 1 tablet (25 mg total) by mouth 3 times daily as needed    30 tablet  1     Allergies:   Allergies   Allergen Reactions   . No Known Drug Allergy    . No Known Latex Allergy      Review of Systems:   Constitutional: negative for fevers, chills, fatigue, or unintentional weight loss  Eyes: negative for visual disturbance and irritation.  Ears, nose, mouth, throat, and face: negative for ear drainage, earaches, hearing loss, nasal congestion and sore throat  Respiratory: negative for wheezing, cough, or dyspnea.  Cardiovascular: negative for chest pain, exertional chest pressure/discomfort, fatigue, lower extremity edema, palpitations and poor exercise tolerance.  Gastrointestinal:   negative for abdominal pain, change in bowel habits or melena.  Genitourinary:  negative for decreased stream, dysuria and frequency.  Hematologic/lymphatic: negative for bleeding, easy bruising and lymphadenopathy  Musculoskeletal:  negative for arthralgias, muscle weakness and myalgias  Neurological:+ coordination problems, no headaches, seizures, speech problems and weakness.  Behavioral/Psych:  negative for abusive relationship, anxiety, depression, and sleep disturbance.  Allergic/Immunologic: negative for allergic reactions    Vitals:  Filed Vitals:    10/31/10 1545   BP: 126/90   Pulse: 113   Weight: 59.875 kg (132 lb)     Exam:  General appearance: alert, cooperative and mild distress, appears flushed and anxious  Oropharynx: lips, mucosa, and tongue normal; teeth and gums normal  Neck:  Normal thyroid, no cervical or supraclavicular LAD, no masses  TMs normal, canals clear.  Lungs: clear to auscultation bilaterally  Heart: regular rate and rhythm, S1, S2 normal, no murmur, click, rub or gallop, pulse 96 manually counted  Abdomen: soft, non-tender; bowel sounds normal; no masses,  no  organomegaly  Extremities: extremities normal, atraumatic, no cyanosis or edema   Neurologic: Alert and oriented X 3, normal strength and tone. PERRL.  Normal symmetric reflexes 2+.  Tandem gait a little unsteady but able to walk straight line.  Rhomberg abnormal (sways to the left without any touching/nudging).  Finger to nose and heel-to-shin normal.  Mental status: affect: slightly anxious but normal     Assessment and Plan:    1.  Vestibular dysfunction:  Reassuring that she had normal CT for acute bleed, tumor.    Most likely vestibular problem rather than cerebellar problem based on exam.  Link between recent unexplained anaphylactic reaction is unclear, though inner ear fluid is possible.  Referred to urgent ENT, called and appt given 8:30am tomorrow.  Check CBC (f/u of elevated WBC).  May use meclizine prn.  Rx for renewed methylprednisolone.  Slight possibility of MS, so if not improving, would consider MRI.

## 2010-10-31 NOTE — Telephone Encounter (Signed)
Your patient has been scheduled on October 10 @ 1PM With GENERAL NEUROLOGY AT [facility]  .  Please notify your patient that they will receive a packet in the mail 2 weeks prior to the appointment that will include info and directions to our office.  If your patient cannot make this appointment, has questions or concerns please have them call the office directly at [phone].    Please be aware that your patient may likely require authorizations from their insurance company [employer]  for appointments with our office.          AMB REFERRAL TO NEUROLOGY (Order [id number])          ----- Message -----     From: Milus Height     Sent: 10/31/2010  10:18 AM       To: Alric Seton Staff  Subject: Order for Jasmine Nunez                            Patient Name: Jasmine Nunez([id number])  Sex: Female  DOB: 02-23-1971      PCP: Inocencio Homes    Center: None     Types of orders made on 10/30/2010: Medications, Outpatient Referral    Order Date:10/30/2010  Ordering User:CICILLINE, MICHELLE [[id number]]  Encounter Provider:Cicilline, Marcelino Duster, MD [phone]  Authorizing Provider: Inocencio Homes, MD [[id number]]  Department:[facility][[id number]]    Order Specific Information  Order: AMB REFERRAL TO NEUROLOGY [Custom: [id number]]  Order #: [id number]  Qty: 1    Priority: Routine  Class: Internal Referral    Associated Diagnoses      780.4 Dizziness      REASON FOR CONSULT: -> Dizziness         AUTH # NEEDED? -> NO         TRACK REFERRAL? -> Yes         PATIENT WILL CALL YOUR OFFICE TO SCHEDULE APPT? -> No         PLEASE CALL PATIENT TO SCHEDULE APPOINTMENT: -> Yes         SCHEDULE TO FIRST AVAILABLE PROVIDER? -> Yes           Priority: Routine  Class: Internal Referral      Scheduling Instructions:    What is your clinical question for the Neurologist? Dizziness, headaches    Associated Diagnoses      780.4 Dizziness      REASON FOR CONSULT: -> Dizziness         AUTH # NEEDED? -> NO         TRACK REFERRAL? -> Yes          PATIENT WILL CALL YOUR OFFICE TO SCHEDULE APPT? -> No         PLEASE CALL PATIENT TO SCHEDULE APPOINTMENT: -> Yes         SCHEDULE TO FIRST AVAILABLE PROVIDER? -> Yes          Referral Associated with Order         Referral ID Date Referral Status Decision Date     [id number] 10/31/2010 New Request 10/31/10                             Referred By Referral Type Referral Reason     Inocencio Homes Referral Consult and treat  To Location/POS To Provider To Specialty     none none Neurology                         Priority Visits Requested Expiration Date     Routine 1             To Department:    To Dept Specialty: Neurology         Order Information                Date Ordering/Authorizing Department     10/30/2010 Inocencio Homes, MD [facility]                   Order Providers         Authorizing Provider Encounter Provider     Inocencio Homes, MD Inocencio Homes, MD                               Associated Diagnoses      Dizziness                 Scheduling Instructions      What is your clinical question for the Neurologist? Dizziness, headaches           Order Questions         Question Answer Comment     REASON FOR CONSULT: Dizziness       AUTH # NEEDED? NO       TRACK REFERRAL? Yes       PATIENT WILL CALL YOUR OFFICE TO SCHEDULE APPT? No       PLEASE CALL PATIENT TO SCHEDULE APPOINTMENT: Yes       SCHEDULE TO FIRST AVAILABLE PROVIDER? Yes                   Detailed Information      Priority and Order Details             [employer] Encounter

## 2010-10-31 NOTE — Telephone Encounter (Signed)
Please call Branae she is still having a lot of dizziness and she is not feeling very well

## 2010-11-01 ENCOUNTER — Ambulatory Visit: Payer: Self-pay | Admitting: Otolaryngology

## 2010-11-01 ENCOUNTER — Encounter: Payer: Self-pay | Admitting: Otolaryngology

## 2010-11-01 VITALS — BP 114/71 | HR 119 | Ht 62.0 in | Wt 136.0 lb

## 2010-11-01 DIAGNOSIS — R42 Dizziness and giddiness: Secondary | ICD-10-CM

## 2010-11-01 NOTE — Telephone Encounter (Signed)
I didn't have a chance to  Call today. Could you call on Monday?

## 2010-11-01 NOTE — Telephone Encounter (Signed)
Dr. Porfirio Mylar,  Can you try to get her Neurology appointment moved up.  She came in to see Dr. Marjo Bicker yesterday and we got her in to the [facility] this morning.  They thought her symptoms were more migraine related.  She is having a hard time with the dizziness especially with taking care of her kids.  TY

## 2010-11-01 NOTE — Progress Notes (Signed)
Subjective:       Jasmine Nunez is a 40 y.o. female who presents with concerns of dizziness.  This began on 10/29/10.  She has had previous dizziness episodes that self resolve. Denies vertigo. She has been having increasing headaches over past 6-8 months. Happening 4-5 times per month. Headaches are located at the back of the head or over the left eye. Left eye twitches when this happens. She becomes photo/phonophobic. She felt drunk and had to focus to move her muscles in a coordinated fashion yesterday. She felt difficulty forming words when trying to speak that lasted for a few hours.  Presently complaining of nausea, left maxillary/ethmoid/left eye pressure. For birth control, she uses an IUD which does not contain hormones.    She started Qvar and stopped within the last week.  After having a significant allergic reaction she was prescribed a prednisone dose pack. She was prescribed amitriptyline over the last week and has not started to take it yet.    CT scan head on 10/25/10 was negative for masses, hemorrhages, or lesions.  Both parents with a history of anxiety disorder.    Social history:  She lives with her husband and two children (ages 2 and 4). They have no pets. They live in a house that has mostly wall-to-wall carpeting. There are no known problems with mold or water damage in the home. She does not smoke, and drinks 1 alcoholic drink per week (does not drink wine), and denies illicit drug use. 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    She denies otalgia, tinnitus, aural fullness, fluctuating hearing loss, loud noise triggering sx, family history of ear diseases, past psychiatric dx, past neurologic dx, history of head trauma with LOC, heart palpitations, headaches, weakness/tingling of the body, facial asymmetry, dehydration, blacking out, loss of vision, presyncope/syncope, excessive thirst/urination, thyroid dx, new herbal supplements    History:       has a  past medical history of GERD (gastroesophageal reflux disease); History of migraine headaches; and Allergic rhinitis.   has past surgical history that includes converted procedure and knee surgery (1986).   reports that she has never smoked. She does not have any smokeless tobacco history on file. She reports that she drinks alcohol. She reports that she does not use illicit drugs.  family history includes Allergic rhinitis in her sister; Asthma in her mother and sister; Cancer in her maternal grandmother; Conversion in an other family member; Diabetes in her maternal grandmother and mother; and Heart failure in her maternal grandmother and mother.    Allergies:   Review of patient's allergies indicates no known allergies.     Medications:     Current Outpatient Prescriptions   Medication Sig   . amitriptyline (ELAVIL) 10 MG tablet    . epinephrine (EPIPEN) 0.3 MG/0.3ML DEVI Inject 0.3 mg into the muscle once as needed       . FISH OIL Take 1 capsule by mouth daily as needed       . cholecalciferol (VITAMIN D) 1000 UNIT tablet Take 1,000 Units by mouth daily       . omeprazole (PRILOSEC) 20 MG capsule take one capsule by mouth every day   . Multiple Vitamin tablet TAKE 1 TABLET DAILY.   Marland Kitchen CHERATUSSIN AC 100-10 MG/5ML liquid    . predniSONE (DELTASONE) 20 MG tablet    . methylPREDNISolone (MEDROL) 4 MG tablet Taper as directed   . meclizine (  ANTIVERT) 25 MG tablet Take 1 tablet (25 mg total) by mouth 3 times daily as needed     . beclomethasone (QVAR) 80 MCG/ACT inhaler Shake well. Use 1 puff each nostril twice daily using a baby bottle nipple.   . cetirizine (ZYRTEC) 10 MG tablet Take 10 mg by mouth daily           Review of Systems:     Dizziness, blurry vision, heart burn, muscle aches, watery itchy eyes, ringing in the ears, stuffy nose, dry mouth, hoarse voice, upset stomach, decreased energy, daytime sleepiness. All other ENT ROS otherwise negative.      Objective:     Orthostatic blood pressure:    Laying:   106/67  Sitting:  112/69  Stand:   115/70     BP 114/71  Pulse 119  Ht 1.575 m (5\' 2" )  Wt 61.689 kg (136 lb)  BMI 24.87 kg/m2    General:   Normocephalic, well nourished, with appropriate affect in NAD.  A &O x 3.    NEURO:  CN II-XII grossly intact.  Normal cerebellar function tests with heel to shin, finger to nose.  Normal ULE/LLE strength and symmetry.  Negative Dix Hallpike bilaterally.  Normal Halmagyi headthrust test.  Negative Fukuda.  Negative Romberg.       CARDIAC:  RRR, no bruits auscultated in neck bilaterally     Head and Face:  The head and face reveal normal facial symmetry without lesions or scars. The salivary glands are palpated and appear normal without tenderness or mass.    EYES:  Non icteric, normal conjunctiva.  EOMI.   Ears: RIGHT EAR:  Ear pinna is normal in appearance with no scars, lesions or masses. Mastoid bone is non tender. The ear canal is clear.  The tympanic membrane is intact without inflammation or perforation. TM is mobile to pneumatic otoscopy without evidence of middle ear effusion. Using 512 Hz tuning fork AC> BC and normal Weber performed on the bony nasal dorsum.     LEFT EAR:  Ear pinna is normal in appearance with no scars, lesions or masses. Mastoid bone is non tender. The ear canal is clear. The tympanic membrane is intact without inflammation or perforation. TM is mobile to pneumatic otoscopy without evidence of middle ear effusion. Using 512 Hz tuning fork AC> BC and normal Weber performed on the bony nasal dorsum.   Nose:    External nose is normal. The nasal mucosa is not inflamed. The nasal septum is generally midline and there is no evidence of septal perforation or hematoma. The inferior turbinates are normal without masses or obstructions. No polyps are visualized. The paranasal sinuses are non tender.   Oral:   ORAL CAVITY: The lips, teeth, tongue and buccal mucosa appear normal without lesions or inflammation.  The uvula and soft palate are normal.  The  tonsils are non erythematous or swollen.  Oropharynx is without lesion, inflammation or swelling.   Neck:  There is no asymmetry or cervical lymphadenopathy noted in either anterior or posterior neck chains or supraclavicular fossa bilaterally. There are no evident masses, trachea is midline and the thyroid gland reveals no enlargement, tenderness nor mass effect.        Assessment:       40 y.o. female with dizziness     Plan:     Her dizziness is not likely vestibular in origin.  I am suspicious her dizziness is associated with her new onset of headaches that sound  migrainous.  I gave her information about migraine headaches with ways to find/avoid migraine triggers. She has an appointment scheduled with a neurologist.  Follow up with ENT as needed.  It has been a pleasure participating in the care of your patient. Thank you for your referral to [facility].  If you have any questions, please feel free to contact me.

## 2010-11-04 NOTE — Telephone Encounter (Signed)
Jasmine Nunez,  Patient is scheduled to see Dr. Lance Muss at Neurology on 01/08/11.  Can you see if we can get her in sooner?

## 2010-11-04 NOTE — Telephone Encounter (Signed)
I CALLED THEY SAID THAT UNFORTUNATELY THEY DO NOT HAVE ANY SOONER APPOINTMENTS HOWEVER I CAN CALL BACK AND SAY THAT DR. CICILLINE WOULD LIKE TO PAGE THE DR. IN THE "TRIAGE ROOM" AND THEY CAN TALK AND IF THEY BOTH DECIDE SHE NEEDS TO BE SEEN SOONER THEY CAN GET HER IN

## 2010-11-05 NOTE — Telephone Encounter (Signed)
Spoke with Joyanna let her know date of appt and also faxed over information to dr. Ellwood Dense office

## 2010-11-05 NOTE — Telephone Encounter (Signed)
Can you call Dr. Vernona Rieger and see if he can get her in sooner?

## 2010-11-05 NOTE — Telephone Encounter (Signed)
lmtcb set Brithney up with dr. Marylynn Pearson [phone] address:[address] [place] [address]  Date: Thursday 8/30 @ 9:15 ...  I Need to fax over pertinent information

## 2010-11-19 ENCOUNTER — Other Ambulatory Visit: Payer: Self-pay | Admitting: Primary Care

## 2010-11-19 DIAGNOSIS — K219 Gastro-esophageal reflux disease without esophagitis: Secondary | ICD-10-CM

## 2010-11-20 MED ORDER — OMEPRAZOLE 20 MG PO CPDR *I*
DELAYED_RELEASE_CAPSULE | ORAL | Status: DC
Start: 2010-11-19 — End: 2011-05-16

## 2010-12-02 ENCOUNTER — Encounter: Payer: Self-pay | Admitting: Gastroenterology

## 2010-12-04 ENCOUNTER — Ambulatory Visit: Payer: Self-pay | Admitting: Sleep Medicine

## 2010-12-04 ENCOUNTER — Ambulatory Visit: Payer: Self-pay

## 2010-12-04 ENCOUNTER — Encounter: Payer: Self-pay | Admitting: Sleep Medicine

## 2010-12-04 DIAGNOSIS — T781XXA Other adverse food reactions, not elsewhere classified, initial encounter: Secondary | ICD-10-CM

## 2010-12-04 DIAGNOSIS — J3089 Other allergic rhinitis: Secondary | ICD-10-CM

## 2010-12-04 MED ORDER — OLOPATADINE HCL 0.1 % OP SOLN *I*
1.0000 [drp] | Freq: Two times a day (BID) | OPHTHALMIC | Status: DC | PRN
Start: 2010-12-04 — End: 2010-12-18

## 2010-12-04 NOTE — Progress Notes (Signed)
Dear Dr. Porfirio Mylar,    This letter is to inform you that our mutual patient,  Jasmine Nunez, was seen at the [facility] for a follow-up evaluation today.  As you know, Mrs. Kizer is a 40 y.o. female with a past medical history significant for migraine headaches, allergic rhinoconjunctivitis, GERD, and pollen food allergy syndrome whom I last saw in July of this year. Since our last visit, she  has been doing fairly well. Thankfully, Mrs. Detwiler has not had any further severe allergic reactions to any foods. She has continued to avoid all melons completely, but has recently eaten corn, beef, pork, and bananas without any adverse reactions. She has an EpiPen with her at all times and is comfortable with the appropriate use and administration of the EpiPen. At our last visit, I recommended that Mrs. Wilton start nasal saline rinses 2 - 3 times per week, as well as intranasal Qvar. She reports that she has not yet started the saline rinses, but she is using the Qvar, and has found it to be helpful in relieving her congestion and postnasal drip. She is taking loratadine 10 mg daily as well. Despite these medications, she reports a significant increase in her nasal congestion, sneezing, and itchy/watery eyes as of the last few weeks. She presents today for allergy skin testing to environmental allergens, as well as to corn, watermelon, canteloupe, and BBQ sauce (same brand as she had eaten the night she had the allergic reaction).     I have reviewed and confirmed the details of the past medical history, surgical history, immunization history, family history, and environmental history as previously detailed in the patient's chart.      ROS:  Except as per HPI, 6 other systems were reviewed and were normal.       Meds:  Reviewed and discussed with the patient.      Physical Examination:  GEN: Alert and interactive, well-nourished, well appearing, NAD  VITALS: Reviewed  separately  HEENT: A complete HEENT examination was done with details as follows: TMs pearly gray with normal landmarks, PERRL, EOMI, conjunctivae pink, sclerae with moderate injection, infraorbital hyperpigmentation bilaterally, nasal mucosa is moderately edematous with enlarged inferior turbinates and narrowed nasal passages, oropharynx with cobblestoning and visible mucous drainage  NECK: Supple with full ROM, no thyromegaly, no appreciable lymphadenopathy  LUNGS: Clear to auscultation bilaterally  HEART: Normal S1,S2 without murmurs  EXT: Without clubbing, cyanosis, or edema  SKIN: Clear, no lesions or rashes noted    Results:  Skin tests -   Environmental Positives: cats, dogs, spring pollens (sugar maple, red cedar, willow), summer grasses (grass mix, smooth brome, yellow dock), and fall pollens (artemesia, cocklebur, kochia, and ragweed)  Environmental Negatives: house dust mites, molds  Food Positives: slight reactivity to corn (not significant enough to qualify as a positive reaction, and patient reports she has regularly been eating foods with corn with no adverse reactions)  Food Negatives: watermelon, canteloupe (both with commercially prepared extracts as well as fresh fruit), fresh mint leaves, BBQ sauce    Assessment:  Jasmine Nunez is a 40 y.o. 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 PFAS is an example of cross-reactivity. Proteins in fruits and vegetables cause the reaction because they're similar to those allergy-causing proteins found in certain pollens. For example, individuals  with ragweed allergy will often react to melons and bananas. Most of the time, allergy skin testing to the cross-reactive fruits is negative, as was the case for Mrs. Dhondt. The vast majority of people with PFAS experience isolated oral itching or  tingling when they eat the culprit fruit or vegetable, but in rare cases, severe allergic reactions can occur. This is the most likely case with Mrs. Reish. She has extensive environmental allergies, and continues to be quite symptomatic despite reported adherence to allergy medications. As I explained to Mrs. Grunewald, she may stand to benefit more from allergy immunotherapy than from medications alone given the extent of her allergies.      Recommendations:  - Continue loratadine (Claritin) 10 mg daily, and she can certainly take 10 mg twice a day. If she finds that the twice-daily loratadine isn't helping, she will try fexofenadine (Allegra) 180 mg by mouth once a day.     - Try either nasal saline rinses, or alternatively, she will just use a saline spray in each side of her nose as often as she feels she needs to.     - Continue the nasal Qvar 1 puff in each naris using a baby bottle nipple with the tip cut off, twice a day every day.     - For itchy/watery eyes, she can use Patanol (olopatadine) 1 drop in each eye twice a day as needed. These drops are safe to use with contact lenses.     - Mrs. Frutiger will continue to avoid all melons (canteloupe, honeydew, watermelon, muskmelon, etc.), and make sure she has an EpiPen available at all times to use in the event of a severe allergic reaction. The appropriate use and administration of self-injectable epinephrine was reviewed with Mrs. Selvage again today.    - The risks and benefits of allergy immunotherapy were discussed at length with Mrs. Villescas, and she  was given information on allergy immunotherapy to read at home at her convenience.     - I want to thank you for allowing me to participate in Mrs. Sabet's care. We will plan on following up in 6 months, or sooner if needed. It was a pleasure seeing her  today. If you have any questions or concerns, please do not hesitate to contact me.    Sincerely,  Nadara Eaton, MD

## 2010-12-04 NOTE — Patient Instructions (Signed)
- Jasmine Nunez, you are allergic to cats, dogs, spring pollens (sugar maple, red cedar, willow), summer grasses (grass mix, smooth brome, yellow dock), and fall pollens (artemesia, cocklebur, kochia, and ragweed).     - Continue loratadine (Claritin) 10 mg daily, and you can certainly take 10 mg twice a day. If you find that it isn't helping, try fexofenadine (Allegra) 180 mg by mouth once a day.     - Try either nasal saline rinses, or alternatively, just use a saline spray in each side of your nose as often as you feel you need to. This will keep the inside of your nose moist and will help thin out mucus. If you do the rinses, or use the saline spray, do so before applying the nasal Qvar, so that you spray the Qvar onto a clean nasal mucosa and it will be more effective.     - Continue the nasal Qvar 1 puff in each side of your nose twice a day every day.     - For itchy/watery eyes, you can use Patanol (olopatadine) 1 drop in each eye twice a day as needed. Place the drops in your eyes approximately 10 minutes before putting in your contacts.     - Avoid all melons (canteloupe, honeydew, watermelon, muskmelon, etc.), and make sure you have an EpiPen available at all times to use in the event of a severe allergic reaction.     - Let's plan to follow-up in 6 months, or sooner if needed. If you have any questions or concerns by all means don't hesitate to contact me! Dr. Berna Spare.     Pollen-food allergy syndrome  In many people who have hay fever, fresh fruits and vegetables and certain nuts and spices can trigger an allergic reaction that causes the mouth to tingle or itch. In some people, pollen-food allergy syndrome -- sometimes called oral allergy syndrome -- can cause itching and even swelling of the throat. This is an example of cross-reactivity. Proteins in fruits and vegetables cause the reaction because they're similar to those allergy-causing proteins found in certain pollens. For example, if you're allergic to  ragweed, you may also react to melons; if you're allergic to birch pollen, you may also react to apples. Cooking fruits and vegetables can help you avoid this reaction. Most cooked fruits and vegetables generally don't cause cross-reactive oral allergy symptoms.     Common cross-reactivity between pollens and fruits and vegetables:   If you are allergic to [place] pollen Ragweed pollen Grasses Mugwort pollen   You may also have a reaction to: Apples  Carrots  Celery  Hazelnuts  Peaches  Plums  Pears  Raw potatoes Bananas  Melons  (cantaloupe, honeydew and watermelon) Tomatoes   Tomatoes  Oranges    Apples  Carrots  Celery  Kiwi fruit  Peanuts  Some spices (caraway seeds, parsley, coriander, anise seeds, fennel seeds)     The most frequent reaction involves itchiness or swelling of the mouth, face, lip, tongue and throat. Symptoms usually appear immediately after eating raw fruits or vegetables, although the reaction can occur more than an hour later.    Rarely, OAS can cause severe throat swelling or anaphylaxis in a person who is highly allergic.    Allergy Shots (Immunotherapy)    Allergen immunotherapy, also known as allergy shots, is a form of long-term treatment that decreases symptoms for many people with allergic rhinitis, allergic asthma, conjunctivitis (eye allergy) or stinging insect allergy.  Allergy shots decrease sensitivity  to allergens and often leads to lasting relief of allergy symptoms even after treatment is stopped. This makes it a cost-effective, beneficial treatment approach for many people.    Who Can Benefit From Allergy Shots?  Both children and adults can receive allergy shots, although it is not typically recommended for children under age 78. This is because of the difficulties younger children may have in cooperating with the program and in articulating any adverse symptoms they may be experiencing. When considering allergy shots for an older adult, medical conditions such as cardiac  disease should be taken into consideration and discussed with your allergist / immunologist first.  You and your allergist / immunologist should base your decision regarding allergy shots on:  . Length of allergy season and severity of your symptoms   . How well medications and/or environmental controls are helping your allergy symptoms   . Your desire to avoid long-term medication use   . Time available for treatment (allergy shots requires a significant commitment)  . Cost, which may vary depending on region and insurance coverage  Allergy shots are not used to treat food allergies. The best option for people with food allergies is to strictly avoid that food.    How Do Allergy Shots Work?  Allergy shots work like a vaccine. Your body responds to injected amounts of a particular allergen, given in gradually increasing doses, by developing immunity or tolerance to the allergen.  There are two phases:  . Build-up phase. This involves receiving injections with increasing amounts of the allergens about one to two times per week. The length of this phase depends upon how often the injections are received, but generally ranges from three to six months.   . Maintenance phase. This begins once the effective dose is reached. The effective maintenance dose depends on your level of allergen sensitivity and your response to the build-up phase. During the maintenance phase, there will be longer periods of time between treatments, ranging from two to four weeks. Your allergist / immunologist will decide what range is best for you.  You may notice a decrease in symptoms during the build-up phase, but it may take as long as 12 months on the maintenance dose to notice an improvement. If allergy shots are successful, maintenance treatment is generally continued for three to five years. Any decision to stop allergy shots should be discussed with your allergist / immunologist.     How Effective Are Allergy Shots?  Allergy shots have  shown to decrease symptoms of many allergies. It can prevent the development of new allergies, and in children it can prevent the progression of allergic disease from allergic rhinitis to asthma. The effectiveness of allergy shots appears to be related to the length of the treatment program as well as the dose of the allergen. Some people experience lasting relief from allergy symptoms, while others may relapse after discontinuing allergy shots. If you have not seen improvement after a year of maintenance therapy, your allergist / immunologist will work with you to discuss treatment options.  Failure to respond to allergy shots may be due to several factors:  . Inadequate dose of allergen in the allergy vaccine   . Missing allergens not identified during the allergy evaluation  . High levels of allergen in the environment   . Significant exposure to non-allergic triggers, such as tobacco smoke    Where Should Allergy Shots Be Given?  This type of treatment should be supervised by a specialized physician in a  facility equipped with proper staff and equipment to identify and treat adverse reactions to allergy injections. Ideally, immunotherapy should be given in your allergist / immunologist's office. If this is not possible, your allergist / immunologist should provide the supervising physician with comprehensive instructions about your allergy shot treatments.    Are There Risks?  A typical reaction is redness and swelling at the injection site. This can happen immediately or several hours after the treatment. In some instances, symptoms can include increased allergy symptoms such as sneezing, nasal congestion or hives.  Serious reactions to allergy shots are rare. When they do occur, they require immediate medical attention. Symptoms of an anaphylactic reaction can include swelling in the throat, wheezing or tightness in the chest, nausea and dizziness. Most serious reactions develop within 30 minutes of the  allergy injections. This is why it is recommended you wait in your doctor's office for at least 30 minutes after you receive allergy shots.

## 2010-12-18 ENCOUNTER — Ambulatory Visit: Payer: Self-pay | Admitting: Primary Care

## 2010-12-18 ENCOUNTER — Encounter: Payer: Self-pay | Admitting: Primary Care

## 2010-12-18 VITALS — BP 102/60 | HR 97 | Ht 62.21 in | Wt 134.0 lb

## 2010-12-18 DIAGNOSIS — G43909 Migraine, unspecified, not intractable, without status migrainosus: Secondary | ICD-10-CM

## 2010-12-18 DIAGNOSIS — L608 Other nail disorders: Secondary | ICD-10-CM

## 2010-12-18 DIAGNOSIS — D72829 Elevated white blood cell count, unspecified: Secondary | ICD-10-CM

## 2010-12-18 DIAGNOSIS — F419 Anxiety disorder, unspecified: Secondary | ICD-10-CM

## 2010-12-18 LAB — FOLATE: Folate: 19.2 ng/mL (ref >=4.6)

## 2010-12-18 LAB — CBC AND DIFFERENTIAL
Baso # K/uL: 0 10*3/uL (ref 0.0–0.1)
Basophil %: 0.4 % (ref 0.1–1.2)
Eos # K/uL: 0.3 10*3/uL (ref 0.0–0.4)
Eosinophil %: 4.1 % (ref 0.7–5.8)
Hematocrit: 40 % (ref 34–45)
Hemoglobin: 13.5 g/dL (ref 11.2–15.7)
Lymph # K/uL: 2.3 10*3/uL (ref 1.2–3.7)
Lymphocyte %: 34.1 % (ref 19.3–51.7)
MCV: 83 fL (ref 79–95)
Mono # K/uL: 0.4 10*3/uL (ref 0.2–0.9)
Monocyte %: 5.3 % (ref 4.7–12.5)
Neut # K/uL: 3.8 10*3/uL (ref 1.6–6.1)
Platelets: 235 10*3/uL (ref 160–370)
RBC: 4.9 MIL/uL (ref 3.9–5.2)
RDW: 13.3 % (ref 11.7–14.4)
Seg Neut %: 56.1 % (ref 34.0–71.1)
WBC: 6.8 10*3/uL (ref 4.0–10.0)

## 2010-12-18 LAB — TSH: TSH: 1.99 u[IU]/mL (ref 0.27–4.20)

## 2010-12-18 LAB — T4, FREE: Free T4: 1.1 ng/dL (ref 0.9–1.7)

## 2010-12-18 LAB — IRON: Iron: 48 ug/dL (ref 34–165)

## 2010-12-18 LAB — FERRITIN: Ferritin: 27 ng/mL (ref 10–120)

## 2010-12-18 LAB — VITAMIN B12: Vitamin B12: 803 pg/mL (ref 211–946)

## 2010-12-18 MED ORDER — FLUOXETINE HCL 20 MG PO CAPS *I*
20.0000 mg | ORAL_CAPSULE | Freq: Every day | ORAL | Status: DC
Start: 2010-12-18 — End: 2011-01-15

## 2010-12-18 NOTE — Progress Notes (Signed)
Subjective:     Patient ID: Jasmine Nunez is a 40 y.o. female.    HPI  Jasmine Nunez is a 40 yo woman who comes in for f/u h/a's.  Dr. Marylynn Pearson from neurology saw her for migraines.  She's been having a bad stretch of headaches.  Has felt really tired for the past few days.  Every morning that she wakes up, she aches from head to toe.  By mid-day she has the worst headache.  Doesn't like the dx' of fibromyalgia.  Nails have been weak.  Wondering if she has lupus.  Has thinning hair on the top of her head.  Having pretty bad stomach cramps.  Having some diarrhea, no blood in stool.  Diarrhea every 2 months.  Had a cluster of canchre sores the other day.  Was having some tenderness and swelling in the left breast.  Saw Dr. Lanora Manis and had a MMG.    Eupha has Essential Hypercholesterolemia; Allergic Rhinitis; Vitamin D Deficiency; Migraine; and Pollen-food allergy syndrome on her problem list.  Jasmine Nunez has a current medication list which includes the following prescription(s): sumatriptan, eletriptan hydrobromide, multiple vitamin, omeprazole, amitriptyline, epinephrine, beclomethasone, fish oil, and cholecalciferol.  [name] is allergic to environmental allergies and food allergy formula.    Review of Systems          Objective: BP 102/60  Pulse 97  Ht 1.58 m (5' 2.21")  Wt 60.782 kg (134 lb)  BMI 24.35 kg/m2  SpO2 97%     Physical Exam   Constitutional: She is oriented to person, place, and time. She appears well-developed and well-nourished.   HENT:   Right Ear: Tympanic membrane normal.   Left Ear: Tympanic membrane normal.   Mouth/Throat: Oropharynx is clear and moist.   Neck: No thyromegaly present.   Cardiovascular: Normal rate and regular rhythm.    No murmur heard.  Pulmonary/Chest: Breath sounds normal.   Abdominal: Bowel sounds are normal. There is no tenderness.   Lymphadenopathy:     She has no cervical adenopathy.   Neurological: She is alert and oriented to person, place, and time.             Assessment:       Migraines:  Suspect secondary to stress/ anxiety.  She has done a great deal of work to reduce stress in her life.  At this point, medication would be the best choice  Hair loss:  Once again likely secondary to stress        Plan:    start patient on Prozac.  Warned of possible side effects.  Advised to stop amitriptyline as this doesn't seem to be helping  F/u 4 weeks to be reassessed  Orders Placed This Encounter   Procedures   . Flu vaccine greater than or equal to 3yo with preservative IM   . T4, free   . TSH   . Iron   . Ferritin   . Folate   . Vitamin B12   . Vitamin d 25 hydroxy, d2&d3   . CBC and differential

## 2010-12-23 ENCOUNTER — Encounter: Payer: Self-pay | Admitting: Primary Care

## 2010-12-23 LAB — VITAMIN D
25-OH VIT D2: <4 ng/mL
25-OH VIT D3: 31 ng/mL
25-OH Vit Total: 31 ng/mL (ref 30–80)

## 2011-01-08 ENCOUNTER — Ambulatory Visit: Payer: Self-pay | Admitting: Neurology

## 2011-01-15 ENCOUNTER — Ambulatory Visit: Payer: Self-pay | Admitting: Primary Care

## 2011-01-15 ENCOUNTER — Encounter: Payer: Self-pay | Admitting: Primary Care

## 2011-01-15 DIAGNOSIS — M797 Fibromyalgia: Secondary | ICD-10-CM

## 2011-01-15 DIAGNOSIS — F32A Depression, unspecified: Secondary | ICD-10-CM

## 2011-01-15 MED ORDER — DULOXETINE HCL 30 MG PO CPEP *I*
30.0000 mg | DELAYED_RELEASE_CAPSULE | Freq: Every day | ORAL | Status: DC
Start: 2011-01-15 — End: 2011-02-14

## 2011-01-15 MED ORDER — DULOXETINE HCL 60 MG PO CPEP *I*
60.0000 mg | DELAYED_RELEASE_CAPSULE | Freq: Every day | ORAL | Status: DC
Start: 2011-01-15 — End: 2011-02-14

## 2011-01-15 NOTE — Progress Notes (Signed)
Subjective:     Patient ID: Jasmine Nunez is a 40 y.o. female.    HPI Jasmine Nunez is a 40 year old woman who comes in today for followup of depression and myalgias.  At the last visit, she was started on Prozac 20 mg daily.Since starting prozac, hasn't been sleeping very well.  Feels buzzed most of the day.  Taking prozac in AM.  Still feeling fairly achy.  A few weeks ago, had a very bad bout of ahciness.  Had a massage and felt a lot better.  Feels a little confused when this happens.  Coffee does help at times.  Thinks amitriptyline made her very depressed    Jasmine Nunez has Essential Hypercholesterolemia; Allergic Rhinitis; Vitamin D Deficiency; Migraine; and Pollen-food allergy syndrome on her problem list.  Current Outpatient Prescriptions on File Prior to Visit   Medication Sig Dispense Refill   . SUMAtriptan (IMITREX) 100 MG tablet Take 100 mg by mouth as needed   headache       . Multiple Vitamin tablet daily           . FLUoxetine (PROZAC) 20 MG capsule Take 1 capsule (20 mg total) by mouth daily    30 capsule  1   . omeprazole (PRILOSEC) 20 MG capsule take one capsule by mouth every day  30 capsule  5   . epinephrine (EPIPEN) 0.3 MG/0.3ML DEVI Inject 0.3 mg into the muscle once as needed           . FISH OIL Take 1 capsule by mouth daily as needed           . cholecalciferol (VITAMIN D) 1000 UNIT tablet Take 1,000 Units by mouth daily           . beclomethasone (QVAR) 80 MCG/ACT inhaler Shake well. Use 1 puff each nostril twice daily using a baby bottle nipple.  1 Inhaler  5     Jasmine Nunez is allergic to environmental allergies and food allergy formula.    Review of Systems          Objective:BP 120/64  Pulse 93  Ht 1.58 m (5' 2.21")  Wt 59.24 kg (130 lb 9.6 oz)  BMI 23.73 kg/m2  SpO2 97%     Physical Exam   Constitutional: She appears well-developed and well-nourished.   Musculoskeletal: She exhibits no tenderness.   Psychiatric: She has a normal mood and affect.             Assessment:      Depression: Not  controlled and having side effects with Prozac.    Myalgias: Consistent with fibromyalgia        Plan:      The plan is to stop the Prozac and changed her to Cymbalta which will help depression and myalgias.  Advised patient of possible liver toxicity with Cymbalta.  We'll check a set of LFTs in one month.  Followup in one month

## 2011-01-17 ENCOUNTER — Ambulatory Visit
Admit: 2011-01-17 | Discharge: 2011-01-17 | Disposition: A | Discharge status: Home / Self Care | Payer: Self-pay | Source: Ambulatory Visit | Attending: Obstetrics | Admitting: Obstetrics

## 2011-01-17 ENCOUNTER — Other Ambulatory Visit: Payer: Self-pay | Admitting: Radiology

## 2011-01-17 DIAGNOSIS — R928 Other abnormal and inconclusive findings on diagnostic imaging of breast: Secondary | ICD-10-CM

## 2011-01-21 ENCOUNTER — Telehealth: Payer: Self-pay | Admitting: Primary Care

## 2011-01-21 NOTE — Telephone Encounter (Signed)
Margarit called she is still not sleeping even being on the cymbalta please advise

## 2011-01-22 NOTE — Telephone Encounter (Signed)
Spoke with patient.  She stopped cymbalta 2 days ago.  She is seeing a Veterinary surgeon.  Told her I agree with the plan to stop cymbalta.  She slept great last night.  She'll see her counselor in 2 weeks. achiness has felt better since doing yoga.

## 2011-01-28 ENCOUNTER — Encounter: Payer: Self-pay | Admitting: Gastroenterology

## 2011-01-31 ENCOUNTER — Telehealth: Payer: Self-pay | Admitting: Primary Care

## 2011-01-31 NOTE — Telephone Encounter (Signed)
Jasmine Nunez called to let you know she saw Dr Lavonna Monarch today and he put her on Ambien and an anti depressant for a couple of weeks to see if that helps with the headaches

## 2011-02-14 ENCOUNTER — Ambulatory Visit: Payer: Self-pay | Admitting: Primary Care

## 2011-02-14 VITALS — BP 108/60 | Temp 98.7°F | Ht 63.0 in | Wt 133.0 lb

## 2011-02-14 DIAGNOSIS — J020 Streptococcal pharyngitis: Secondary | ICD-10-CM

## 2011-02-14 DIAGNOSIS — J029 Acute pharyngitis, unspecified: Secondary | ICD-10-CM

## 2011-02-14 LAB — POCT AMBULATORY RAPID STREP
Lot #: 121157
Rapid Strep Group A Throat-POC: POSITIVE

## 2011-02-14 MED ORDER — PENICILLIN V POTASSIUM 500 MG PO TABS *I*
500.0000 mg | ORAL_TABLET | Freq: Three times a day (TID) | ORAL | Status: AC
Start: 2011-02-14 — End: 2011-02-24

## 2011-02-14 NOTE — Progress Notes (Signed)
Subjective:       Jasmine Nunez is a 40 y.o. female who presents for evaluation of sore throat. Associated symptoms include suspected fevers but not measured at home, nasal blockage, post nasal drip, sinus and nasal congestion, sore throat, swollen glands and no cough. Onset of symptoms was 2 days ago, and have been gradually worsening since that time. She is drinking moderate amounts of fluids. She has not had a recent close exposure to someone with proven streptococcal pharyngitis.    Jasmine Nunez  has a past medical history of GERD (gastroesophageal reflux disease); History of migraine headaches; and Allergic rhinitis.  Jasmine Nunez has Essential Hypercholesterolemia; Allergic Rhinitis; Vitamin D Deficiency; Migraine; Pollen-food allergy syndrome; and Fibromyalgia on her problem list.  Jasmine Nunez  has past surgical history that includes converted procedure and knee surgery (1986).  Her family history includes Allergic rhinitis in her sister; Asthma in her mother and sister; Cancer in her maternal grandmother; Conversion in an other family member; Diabetes in her maternal grandmother and mother; and Heart failure in her maternal grandmother and mother.  Jasmine Nunez has a current medication list which includes the following prescription(s): sumatriptan, multiple vitamin, omeprazole, epinephrine, fish oil, cholecalciferol, duloxetine, duloxetine, and beclomethasone.  Jasmine Nunez is allergic to environmental allergies and food allergy formula.    Review of Systems  Pertinent items are noted in HPI.      Objective:      BP 108/60  Temp(Src) 37.1 C (98.7 F) (Temporal)  Ht 1.6 m (5\' 3" )  Wt 60.328 kg (133 lb)  BMI 23.56 kg/m2  SpO2 98%  General:  fatigued, flushed and mild distress   Mouth:  lips, mucosa, and tongue normal; teeth and gums normal and OP with erythema and tonsillar exudate   Neck: marked anterior cervical adenopathy.     Laboratory  Strep test done. Results: positive      Assessment:       Acute Pharyngitis, likely    Strep throat      Plan:      Patient placed on antibiotics.  Use of OTC analgesics recommended as well as salt water gargles.  Patient advised that he will be infectious for 24 hours after starting antibiotics.  Follow up as needed.

## 2011-02-14 NOTE — Patient Instructions (Addendum)
Drink plenty of fluids  Gargling with salt water may be helpful  You will be contagious for 24 hours after starting antibiotics  Change toothbrushes as we discussed.

## 2011-02-17 ENCOUNTER — Encounter: Payer: Self-pay | Admitting: Primary Care

## 2011-02-17 ENCOUNTER — Ambulatory Visit: Payer: Self-pay | Admitting: Primary Care

## 2011-02-17 ENCOUNTER — Telehealth: Payer: Self-pay | Admitting: Primary Care

## 2011-02-17 VITALS — BP 108/78 | HR 83 | Temp 98.5°F | Resp 17 | Ht 63.0 in | Wt 133.0 lb

## 2011-02-17 DIAGNOSIS — G43909 Migraine, unspecified, not intractable, without status migrainosus: Secondary | ICD-10-CM

## 2011-02-17 MED ORDER — TOPIRAMATE 25 MG PO TABS *I*
ORAL_TABLET | ORAL | Status: DC
Start: 2011-02-17 — End: 2011-03-19

## 2011-02-17 MED ORDER — KETOROLAC TROMETHAMINE 30 MG/ML IJ SOLN *I*
30.0000 mg | Freq: Once | INTRAMUSCULAR | Status: AC
Start: 2011-02-17 — End: 2011-02-17
  Administered 2011-02-17: 30 mg via INTRAMUSCULAR

## 2011-02-17 NOTE — Progress Notes (Signed)
Subjective:     Patient ID: Jasmine Nunez is a 40 y.o. female.    HPI  Jasmine Nunez is a 40 yo woman who comes in with a headache.  She had to be taken back from the waiting room and placed in a dark room due to photophobia  Woke up this AM with a headache.  Tried some Advil sinus medication which helped for a while.  Took 2 Imitrex today which helped a little.  Has neck and shoulder pain.  Headache isn't bothering her so much.  Dr. Morrie Sheldon treated her for strep 3 days ago.  She is feeling like this has resolved with the penicillin.  Dr. Marylynn Pearson from neurology has her on sleeping pills and tried to start her on alprazolam.  She took the alprazolam over the weekend but had problems waking up and feeling paralyzed.  It sounded like a hypnagogic hallucination.  She has been started on SSRIs by me and the neurologist, but has had insomnia with both.  She doesn't think she's more stressed out than usual.  She and her husband have been going to counseling and her counselor doesn't think she's depressed.    Jasmine Nunez has Essential Hypercholesterolemia; Allergic Rhinitis; Vitamin D Deficiency; Migraine; Pollen-food allergy syndrome; and Fibromyalgia on her problem list.  Current Outpatient Prescriptions on File Prior to Visit   Medication Sig Dispense Refill   . penicillin v potassium (VEETIDS) 500 MG tablet Take 1 tablet (500 mg total) by mouth 3 times daily for 10 days  30 tablet  0   . SUMAtriptan (IMITREX) 100 MG tablet Take 100 mg by mouth as needed   headache       . Multiple Vitamin tablet daily           . omeprazole (PRILOSEC) 20 MG capsule take one capsule by mouth every day  30 capsule  5   . epinephrine (EPIPEN) 0.3 MG/0.3ML DEVI Inject 0.3 mg into the muscle once as needed           . FISH OIL Take 1 capsule by mouth daily as needed           . cholecalciferol (VITAMIN D) 1000 UNIT tablet Take 1,000 Units by mouth daily             Jasmine Nunez is allergic to environmental allergies and food allergy formula.    Review of  Systems          Objective: BP 108/78  Pulse 83  Temp(Src) 36.9 C (98.5 F) (Temporal)  Resp 17  Ht 1.6 m (5\' 3" )  Wt 60.328 kg (133 lb)  BMI 23.56 kg/m2  SpO2 99%     Physical Exam   Constitutional: She is oriented to person, place, and time. She appears well-developed and well-nourished. She appears distressed.        Sitting in a dark room laying on the exam table   HENT:   Mouth/Throat: Oropharynx is clear and moist. No oropharyngeal exudate.   Cardiovascular: Normal heart sounds.    No murmur heard.  Lymphadenopathy:     She has no cervical adenopathy.   Neurological: She is alert and oriented to person, place, and time. No cranial nerve deficit.             Assessment:      Acute migraine not responding to triptan        Plan:      Gave patient an injection of toradol 30 mg IM x 1.  She responded very well to this treatment.  Started patient on topamax for prevention of migraines

## 2011-02-18 ENCOUNTER — Encounter: Payer: Self-pay | Admitting: Primary Care

## 2011-02-18 ENCOUNTER — Ambulatory Visit: Payer: Self-pay | Admitting: Primary Care

## 2011-02-18 VITALS — BP 120/70 | Ht 63.0 in | Wt 133.0 lb

## 2011-02-18 DIAGNOSIS — G43009 Migraine without aura, not intractable, without status migrainosus: Secondary | ICD-10-CM

## 2011-02-18 MED ORDER — KETOROLAC TROMETHAMINE 30 MG/ML IJ SOLN *I*
60.0000 mg | Freq: Once | INTRAMUSCULAR | Status: AC
Start: 2011-02-18 — End: 2011-02-18
  Administered 2011-02-18: 60 mg via INTRAMUSCULAR

## 2011-02-18 NOTE — Progress Notes (Signed)
Subjective:      Jasmine Nunez is a 40 y.o. female who presents for follow-up of mixed tension and migraine headaches. Home treatment has included Imitrex oral and started Tegretol yesterday, in an effort to prevent rather than treat the headache with little improvement. Headaches are occurring daily yesterday and today; had brief respite with ketorolac yesterday, had in-home evaluation of her son last night, which was stressful, and had to read several pages of small print. This triggered the headache again, and she tried going to bed using zolpidem. Awoke at 3 AM and repeated 1/2 tab zolpidem. Used sumatriptan this am, no NSAIDs thus far this AM. Generally, the headaches last about several hours. Work attendance or other daily activities are affected by the headaches; she has a difficult time thinking straight/"processing". The patient denies loss of balance, muscle weakness, numbness of extremities and speech difficulties. Had some stress last night with husband, baseline stress level is unchanged. Menstrual cycle just ended 4 days ago, after an unusually long 10-day cycle    Jasmine Nunez  has a past medical history of GERD (gastroesophageal reflux disease); History of migraine headaches; and Allergic rhinitis.  Jasmine Nunez has Essential Hypercholesterolemia; Allergic Rhinitis; Vitamin D Deficiency; Migraine; Pollen-food allergy syndrome; and Fibromyalgia on her problem list.  Jasmine Nunez  has past surgical history that includes converted procedure and knee surgery (1986).  Her family history includes Allergic rhinitis in her sister; Asthma in her mother and sister; Cancer in her maternal grandmother; Conversion in an other family member; Diabetes in her maternal grandmother and mother; and Heart failure in her maternal grandmother and mother.  Jasmine Nunez has a current medication list which includes the following prescription(s): topiramate, penicillin v potassium, sumatriptan, multiple vitamin, omeprazole, epinephrine, fish oil, and  cholecalciferol.  Jasmine Nunez is allergic to environmental allergies and food allergy formula.    Review of Systems  Pertinent items are noted in HPI.      Objective:      BP 120/70  Ht 1.6 m (5\' 3" )  Wt 60.328 kg (133 lb)  BMI 23.56 kg/m2  GEN: appears uncomfortable, lying in the dark, wearing sunglasses  HEENT: PERRL, EOMI, some stiffness in the neck  NERUO: CN II-XII intact. Fluent speech. No gait disturbance. Strength intact. No formal Romberg.     Assessment:      Common migraine      Plan:      Lie in darkened room and apply cold packs as needed for pain.  pt has had head imaging without source of headache identified.   Ketorolac 60 mg IM x 1 now. Advised to hold Tegretol until headache free.  Advised that, if headache is unchanged or recurrent, may need ED care for status migrainosus

## 2011-02-27 ENCOUNTER — Telehealth: Payer: Self-pay | Admitting: Primary Care

## 2011-02-27 NOTE — Telephone Encounter (Signed)
 ERROR

## 2011-02-27 NOTE — Telephone Encounter (Signed)
Jasmine Nunez has been trying to get a hold of Jasmine Nunez and he's so far away so she was wondering if there was someone else that she could see also she is leaving for vacation S[place]urday and she had a few questions for the nuerologist but she cant get ahold of hom so she was wondering if maybe Jasmine Nunez could answer some for her please and thank you

## 2011-03-15 ENCOUNTER — Encounter: Payer: Self-pay | Admitting: Gastroenterology

## 2011-03-17 ENCOUNTER — Telehealth: Payer: Self-pay | Admitting: Primary Care

## 2011-03-17 DIAGNOSIS — G43909 Migraine, unspecified, not intractable, without status migrainosus: Secondary | ICD-10-CM

## 2011-03-19 ENCOUNTER — Telehealth: Payer: Self-pay | Admitting: Primary Care

## 2011-03-19 MED ORDER — TOPIRAMATE 25 MG PO TABS *I*
ORAL_TABLET | ORAL | Status: DC
Start: 2011-03-19 — End: 2011-06-10

## 2011-03-19 MED ORDER — SUMATRIPTAN SUCCINATE 100 MG PO TABS *I*
ORAL_TABLET | ORAL | Status: DC
Start: 2011-03-19 — End: 2011-12-08

## 2011-04-11 ENCOUNTER — Ambulatory Visit: Payer: Self-pay | Admitting: Family Medicine

## 2011-04-11 ENCOUNTER — Encounter: Payer: Self-pay | Admitting: Family Medicine

## 2011-04-11 VITALS — BP 102/68 | HR 80 | Temp 97.6°F | Resp 20 | Ht 62.0 in | Wt 130.8 lb

## 2011-04-11 MED ORDER — AZITHROMYCIN 250 MG PO TABS *I*
ORAL_TABLET | ORAL | Status: DC
Start: 2011-04-11 — End: 2011-06-10

## 2011-05-16 ENCOUNTER — Other Ambulatory Visit: Payer: Self-pay | Admitting: Primary Care

## 2011-05-16 DIAGNOSIS — K219 Gastro-esophageal reflux disease without esophagitis: Secondary | ICD-10-CM

## 2011-05-16 MED ORDER — OMEPRAZOLE 20 MG PO CPDR *I*
20.0000 mg | DELAYED_RELEASE_CAPSULE | Freq: Every day | ORAL | Status: DC
Start: 2011-05-16 — End: 2011-11-04

## 2011-06-09 ENCOUNTER — Encounter: Payer: Self-pay | Admitting: Neurology

## 2011-06-10 ENCOUNTER — Ambulatory Visit: Payer: Self-pay | Admitting: Neurology

## 2011-06-10 ENCOUNTER — Encounter: Payer: Self-pay | Admitting: Neurology

## 2011-06-10 VITALS — BP 120/66 | HR 84 | Ht 61.5 in | Wt 125.5 lb

## 2011-06-10 DIAGNOSIS — K219 Gastro-esophageal reflux disease without esophagitis: Secondary | ICD-10-CM | POA: Insufficient documentation

## 2011-06-10 DIAGNOSIS — G43009 Migraine without aura, not intractable, without status migrainosus: Secondary | ICD-10-CM

## 2011-06-10 MED ORDER — NARATRIPTAN HCL 2.5 MG PO TABS *A*
ORAL_TABLET | ORAL | Status: DC
Start: 2011-06-10 — End: 2011-11-30

## 2011-06-10 NOTE — Progress Notes (Signed)
I saw Jasmine Nunez today in the [facility] office for a new patient evaluation of migraine headaches.  She is a 41 y.o. right handed female. The history is obtained from the patient and review of the electronic health record.  I had the opportunity to review outpatient notes from her neurological evaluations by Garey Ham, RPA-C on 9/31/2012, 01/31/2011, and 03/15/2011.     HPI: She tells me that she started having migraines a year or a year and a half ago.  They have been progressively worsening, and around last Thanksgiving they became "astronomical, horrible, off the charts".  She keeps a diary to try to identify any patterns to them. They seem to be related to her menstrual cycle.  It is not so much a headache as it is a whole body experience.  Her symptoms start out slowly.  She wakes up and feels "horrendous, very unwell".  She feels like she was run over by a truck. Every muscle hurts.  Her hair hurts.  Her eyes hurt.  Sometimes she has blurred vision in her left eye.  She is very sensitive to light.  By mid afternoon or evening she has a pounding headache in the back of her head.  Sometimes the pain is on just one side.  Sometimes it starts in the front of her head.  One time it felt like someone was spanking her in the front of her head.  She is nauseated sometimes with these.  Her fingers, hands, and feet can hurt.  Her symptoms don't seem to be related to anything in her diet like alcohol or caffeine.  She has made a point of keeping herself hydrated.  She starts having symptoms in the days leading up to her period, but the migraines don't develop until the middle or end of her period.  Her periods used to be regular, lasting 5-7 days, but have become longer, lasting up to 12 days.      Her headaches can last for days.  She takes sumatriptan 100 mg, which seems to work well for migraines triggered by stress, but the migraine does not resolve.  She feels tired after she takes it, and it  helps her relax.  The next morning she goes through the same symptoms.  Her migraine symptoms are "very tenacious". She usually doesn't take the sumatriptan until the afternoon with the intent of saving her medication.  She also doesn't want to be sedated while she is at home with her young children.  She takes topiramate 25 mg 2 tablets in the morning and 3 tablets in the evening.  She thought that this was helping because she had no migraines in January after having a bad month in December.  Then her migraines came back full force in February.  She has lost about 10 pounds since starting this.  She initially had some word-finding difficulty memory loss, but this has improved.  She has these symptoms around her period as well.      For the week leading up to her period she has trouble sleeping and can feel very anxious.  She tried amitriptyline, but this made her feel very depressed.  Fluoxetine made it hard for her to sleep for a month, and she was over-stimulated.  She then tried Cymbalta.  Prior to all of this starting, she and her husband had gone through a very stressful time.  She thought the stress was causing her symptoms, and expected she would feel better when the  stress was gone.  Once the stress was gone, her symptoms persisted and got worse.  She sees a counselor who told her that he did not think she is depressed.  She has always had anxiety, but never had problems managing this.  She ended up stopping the Cymbalta, and has done well since.  She continues to have some anxiety and difficulty sleeping around her menses, but then this clears up. If she has anxiety now, it seems to be situational.  She considers herself a type-A personality.       She thinks that her sister experiences migraines.  She thinks that her mother went into menopause early.  She does not use birth control pills.  She does not smoke cigarettes.  She has no history of bleeding or clotting disorder, DVT, or PE.  She has no history  of head injury.        The active problem list, past medical/surgical history, medications, allergies, social history, and family history were reviewed with the patient and/or caregiver.  I documented/updated these history elements and marked them as reviewed in the appropriate sections of the electronic health record.    REVIEW OF SYSTEMS:  She completed a 15 point review of systems questionnaire that I reviewed today and that will be scanned into the electronic health record.     EXAMINATION:  Filed Vitals:    06/10/11 1422   BP: 120/66   Pulse: 84   Height: 1.562 m (5' 1.5")   Weight: 56.926 kg (125 lb 8 oz)   Body mass index is 23.33 kg/(m^2).      GENERAL EXAM:  She appears mildly overweight but otherwise healthy.  She seems comfortable. She is well dressed and groomed.  The heart rhythm is regular.  There is no significant cardiac murmur heard.  The lungs are clear to auscultation bilaterally.  There are no bruits heard the neck.  Radial pulses are strong and symmetric. There is full range of active cervical spine flexion and extension, with discomfort in the posterior neck caused by both movements.    NEUROLOGICAL EXAM:  MENTAL STATUS:  She is awake and alert.  She had difficulty maintaining attention at times.  She was not able to reverse the word world.  Language testing is normal.  There is no evidence of neglect or apraxia.  She is fully oriented to person, place, and time. She registered 2/3 objects on the first attempt.  She was not able to register 3/3 objects despite 4 attempts. She recalled 3/3 objects at one minute.  She is pleasant and seems relaxed, though she became tearful at the end of the visit after cognitive testing.  There is no apparent abnormality of thought content or process.     CRANIAL NERVES:   Funduscopic examination reveals normal disks and blood vessels with venous pulsations in both eyes.  The visual fields are full to confrontation.  The pupils are equal and reactive without  afferent pupillary defect.  Ocular versions are full and conjugate.  There is no nystagmus.  Horizontal saccade testing is normal without internuclear ophthalmoplegia.  Facial sensation to light touch is symmetric.  Masseter bulk is normal bilaterally. Jaw closing power is normal.  Facial expression is normal.  Volitional facial movements are symmetric and normal.  Hearing of a vibrating 128 Hz tuning fork through air is symmetric.  The palate elevates symmetrically.  There is no dysarthria or dysphonia.  Sternocleidomastoid and trapezius power are full bilaterally.  The tongue  protrudes in the midline.    MOTOR:  Muscle bulk and tone are normal in all extremities.  There is no pronator drift.  There is no kinetic or postural hand tremor.  Finger, hand, and foot tapping are normal bilaterally.  Power is full proximally and distally in all of the extremities on confrontational strength testing.     SENSORY:  Pin prick sensation is normal in the hand and foot bilaterally.  Vibratory sense is 26/20 seconds at the great toe and 28/30 seconds at the DIP joint of the third hand digit.  There is no extinction to double simultaneous stimulation, and touch localization is normal.  There is no Romberg sign.     REFLEXES:  Biceps 3/3, brachioradialis 3/3, triceps 2/2, patella 3/3, Achilles 2/2.  The plantar responses are flexor bilaterally.     COORDINATION:  Finger-nose-finger and heel-to-shin are performed without ataxia bilaterally.     GAIT:  Casual gait is stable with normal base, stride length, and arm swing.  She is able to perform tandem, toe, and heel walking without difficulty.     LABORATORY DATA: (12/18/2010) TSH 1.99, Free T4 1.1    IMAGING DATA: I reviewed the images of her non-contrast head CT performed 10/25/2010 for evaluation of persistent headaches.  This is a normal study.    IMPRESSION:   This is a 41 year old woman with prolonged and severe migraine headaches without aura.  Her neurological exam is normal  today.  Sumatriptan has not been an effective acute therapy for her because her symptoms do not resolve and it has a sedative effect.  Her migraines are associated with multiple somatic symptoms that I do not think are part of her migraine.  Her fibromyalgia may be triggering her migraines.  We suggested both preventative and acute management of migraines.  I suggested weaning off topiramate since this has not been effective, and I don't think that higher doses will provide any benefit.  We could try gabapentin in the future if needed.  This may be helpful for her generalized pains as well.  I suggested trying another triptan.  The goal of acute migraine therapy is rapid and complete relief of migraine symptoms without disability.  If she is able to effectively treat her migraines when they first start, they may not recur.  I suggested a trial of naratriptan.  This has a longer half-life than other triptans and may be more effective for her prolonged migraines.  It is also particularly helpful for menstrual migraines.    PLAN:   She will discontinue sumatriptan.   I provided her with a prescription for naratriptan 2.5 mg with instructions to take one tablet at migraine onset and repeat one tablet if needed after 4 hours.  I provided her with instructions to wean off topiramate.   I asked her to call me in 1-2 weeks to let me know how things are going and to consider a trial of gabapentin for headache prevention and potential help with her fibromyalgia symptoms.   I would like to see her back in 6 months.  I would be happy to see her sooner if needed.    Azucena Kuba, MD  Asst. Professor of Neurology  Pager: [phone]  Office: [phone]  02/12/2012 1:15 PM

## 2011-06-10 NOTE — Patient Instructions (Addendum)
Reduce your total daily dose of topiramate by one tablet every two days, alternating between morning and evening doses, until you are off it.  Try the naratriptan for your next migraine.  Take it as soon as you know one is coming on.  Contact Dr. Latanya Presser in 1-2 weeks to let him know how things are going, and to further discuss trying gabapentin on a daily basis to prevent all of these symptoms from happening.

## 2011-06-17 ENCOUNTER — Encounter: Payer: Self-pay | Admitting: Neurology

## 2011-07-15 ENCOUNTER — Encounter: Payer: Self-pay | Admitting: Neurology

## 2011-07-15 ENCOUNTER — Encounter: Payer: Self-pay | Admitting: Primary Care

## 2011-07-18 ENCOUNTER — Ambulatory Visit
Admit: 2011-07-18 | Discharge: 2011-07-18 | Disposition: A | Discharge status: Home / Self Care | Payer: Self-pay | Source: Ambulatory Visit | Attending: Obstetrics | Admitting: Obstetrics

## 2011-07-18 ENCOUNTER — Other Ambulatory Visit: Payer: Self-pay | Admitting: Obstetrics

## 2011-07-18 DIAGNOSIS — R928 Other abnormal and inconclusive findings on diagnostic imaging of breast: Secondary | ICD-10-CM

## 2011-08-20 ENCOUNTER — Encounter: Payer: Self-pay | Admitting: Primary Care

## 2011-11-04 ENCOUNTER — Other Ambulatory Visit: Payer: Self-pay | Admitting: Primary Care

## 2011-11-04 DIAGNOSIS — K219 Gastro-esophageal reflux disease without esophagitis: Secondary | ICD-10-CM

## 2011-11-05 MED ORDER — BUDESONIDE 32 MCG/ACT NA SUSP *I*
2.0000 | Freq: Every day | NASAL | Status: DC
Start: 2011-11-05 — End: 2011-11-06

## 2011-11-06 ENCOUNTER — Other Ambulatory Visit: Payer: Self-pay | Admitting: Primary Care

## 2011-11-06 DIAGNOSIS — J309 Allergic rhinitis, unspecified: Secondary | ICD-10-CM

## 2011-11-06 NOTE — Telephone Encounter (Signed)
Insurance company did not cover Rhinocort.

## 2011-11-07 MED ORDER — FLUTICASONE PROPIONATE 50 MCG/ACT NA SUSP *I*
2.0000 | Freq: Every day | NASAL | Status: DC
Start: 2011-11-06 — End: 2012-12-01

## 2011-11-30 ENCOUNTER — Other Ambulatory Visit: Payer: Self-pay | Admitting: Neurology

## 2011-12-08 ENCOUNTER — Ambulatory Visit: Payer: Self-pay | Admitting: Neurology

## 2011-12-08 ENCOUNTER — Encounter: Payer: Self-pay | Admitting: Neurology

## 2011-12-08 VITALS — BP 126/84 | HR 90 | Ht 61.5 in | Wt 134.6 lb

## 2011-12-08 DIAGNOSIS — G43009 Migraine without aura, not intractable, without status migrainosus: Secondary | ICD-10-CM

## 2011-12-08 DIAGNOSIS — G4700 Insomnia, unspecified: Secondary | ICD-10-CM | POA: Insufficient documentation

## 2011-12-08 NOTE — Progress Notes (Signed)
I saw Jasmine Nunez in the [facility] office on 12/08/2011 for follow-up evaluation of migraine headaches.  I last saw her  on 06/10/2011.    HPI: She tells me that she is doing a lot better.  She thinks that since she started on her new birth control, she is doing well.  There was some thought that she was low on estrogen.  She is off topiramate completely.  She still gets migraines four or five times a month.  She takes the naratriptan, and it really helps.  If she catches the headache in time, her symptoms improve within 30 minutes.  If she doesn't catch it early, then the medication helps the pain, but doesn't take it away. Sometimes her symptoms develop so slowly she doesn't attribute them to migraine.  She also doesn't like to take medication, so she avoids it if she can.  Her general health has been good.  Sometimes she thinks that the vision in her left eye is blurred, but she has no pain associated with this.  She continues to have trouble with sleeping.      The active problem list, past medical/surgical history, medications, allergies, social history, and family history were reviewed with the patient and/or caregiver.  I documented/updated these history elements and marked them as reviewed in the appropriate sections of the electronic health record.    REVIEW OF SYSTEMS:  She completed a 15 point review of systems questionnaire that I reviewed today and that will be scanned into the electronic health record.     VITALS:  Filed Vitals:    12/08/11 1208   BP: 126/84   Pulse: 90   Height: 1.562 m (5' 1.5")   Weight: 61.054 kg (134 lb 9.6 oz)   Body mass index is 25.02 kg/(m^2).     EXAMINATION:  No formal examination was performed today.     IMPRESSION:   This is a 41 year old woman with migraine headaches without aura.  I spent 15 minutes with Jasmine Nunez today, more than 50% of which was spent in counseling and coordination of care.  Her migraine frequency has improved with changes in her oral  contraceptive pill.  She tolerated weaning off topiramate.  Sumatriptan has been a very effective acute therapy for her.  Her current headache frequency does not warrant starting a new daily medication for headache prevention.  She has chronic insomnia that may play some role in her migraines.  This may be related to her fibromyalgia.  She has no new concerns today.      PLAN:   We are not making any change in her medication regimen today.   I will see her back in one year.  I would be happy to see her sooner should the need arise.    Azucena Kuba, MD  Asst. Professor of Neurology  Pager: [phone]  Office: [phone]  12/08/2011 12:15 PM

## 2012-01-05 ENCOUNTER — Ambulatory Visit: Payer: Self-pay

## 2012-01-05 ENCOUNTER — Other Ambulatory Visit: Payer: Self-pay | Admitting: Primary Care

## 2012-01-05 NOTE — Telephone Encounter (Signed)
Please refill.

## 2012-01-06 MED ORDER — OMEPRAZOLE 20 MG PO CPDR *I*
20.0000 mg | DELAYED_RELEASE_CAPSULE | Freq: Every day | ORAL | Status: DC
Start: 2012-01-05 — End: 2012-12-01

## 2012-01-09 ENCOUNTER — Encounter: Payer: Self-pay | Admitting: Primary Care

## 2012-01-19 ENCOUNTER — Ambulatory Visit
Admit: 2012-01-19 | Discharge: 2012-01-19 | Disposition: A | Discharge status: Home / Self Care | Payer: Self-pay | Source: Ambulatory Visit | Attending: Obstetrics | Admitting: Obstetrics

## 2012-01-19 ENCOUNTER — Other Ambulatory Visit: Payer: Self-pay | Admitting: Primary Care

## 2012-01-19 DIAGNOSIS — R928 Other abnormal and inconclusive findings on diagnostic imaging of breast: Secondary | ICD-10-CM

## 2012-01-28 ENCOUNTER — Ambulatory Visit
Admit: 2012-01-28 | Discharge: 2012-01-28 | Disposition: A | Discharge status: Home / Self Care | Payer: Self-pay | Source: Ambulatory Visit | Attending: Obstetrics | Admitting: Obstetrics

## 2012-01-28 LAB — COMPREHENSIVE METABOLIC PANEL
ALT: 17 U/L (ref 0–35)
AST: 21 U/L (ref 0–35)
Albumin: 4.3 g/dL (ref 3.5–5.2)
Alk Phos: 55 U/L (ref 35–105)
Anion Gap: 12 (ref 7–16)
Bilirubin,Total: 0.2 mg/dL (ref 0.0–1.2)
CO2: 26 mmol/L (ref 20–28)
Calcium: 9 mg/dL (ref 8.8–10.2)
Chloride: 101 mmol/L (ref 96–108)
Creatinine: 0.64 mg/dL (ref 0.51–0.95)
GFR,Black: 130 *
GFR,Caucasian: 113 *
Glucose: 148 mg/dL — ABNORMAL HIGH (ref 60–99)
Lab: 14 mg/dL (ref 6–20)
Potassium: 3.8 mmol/L (ref 3.3–5.1)
Sodium: 139 mmol/L (ref 133–145)
Total Protein: 7 g/dL (ref 6.3–7.7)

## 2012-01-28 LAB — CBC AND DIFFERENTIAL
Baso # K/uL: 0 10*3/uL (ref 0.0–0.1)
Basophil %: 0.3 % (ref 0.1–1.2)
Eos # K/uL: 0.2 10*3/uL (ref 0.0–0.4)
Eosinophil %: 1.7 % (ref 0.7–5.8)
Hematocrit: 40 % (ref 34–45)
Hemoglobin: 13.4 g/dL (ref 11.2–15.7)
Lymph # K/uL: 2.4 10*3/uL (ref 1.2–3.7)
Lymphocyte %: 26.7 % (ref 19.3–51.7)
MCV: 86 fL (ref 79–95)
Mono # K/uL: 0.3 10*3/uL (ref 0.2–0.9)
Monocyte %: 3.4 % — ABNORMAL LOW (ref 4.7–12.5)
Neut # K/uL: 6.2 10*3/uL — ABNORMAL HIGH (ref 1.6–6.1)
Platelets: 270 10*3/uL (ref 160–370)
RBC: 4.7 MIL/uL (ref 3.9–5.2)
RDW: 13.2 % (ref 11.7–14.4)
Seg Neut %: 67.9 % (ref 34.0–71.1)
WBC: 9.1 10*3/uL (ref 4.0–10.0)

## 2012-01-28 LAB — PROLACTIN: Prolactin: 9.3 ng/mL

## 2012-01-28 LAB — CRP: CRP: 64 mg/L — ABNORMAL HIGH (ref 0–10)

## 2012-01-28 LAB — TSH: TSH: 1.1 u[IU]/mL (ref 0.27–4.20)

## 2012-01-28 LAB — FOLLICLE STIMULATING HORMONE: FSH: 0.1 m[IU]/mL

## 2012-01-29 LAB — HEMOGLOBIN A1C: Hemoglobin A1C: 5.1 % (ref 4.0–6.0)

## 2012-01-30 ENCOUNTER — Telehealth: Payer: Self-pay | Admitting: Primary Care

## 2012-01-30 DIAGNOSIS — R7982 Elevated C-reactive protein (CRP): Secondary | ICD-10-CM

## 2012-01-30 NOTE — Telephone Encounter (Signed)
Patient stated that she had blood work done on 10.30.13 and she wanted to let you know that one of the tests came back a little elevated. Please look at all labs from 10.30.13 in our system. Patient stated that her OBGYN said that one of the elevated levels could show an auto immune disease. Thank you.

## 2012-02-02 ENCOUNTER — Encounter: Payer: Self-pay | Admitting: Neurology

## 2012-02-02 DIAGNOSIS — R7982 Elevated C-reactive protein (CRP): Secondary | ICD-10-CM | POA: Insufficient documentation

## 2012-02-02 NOTE — Telephone Encounter (Signed)
email sent

## 2012-02-03 ENCOUNTER — Ambulatory Visit
Admit: 2012-02-03 | Discharge: 2012-02-03 | Disposition: A | Discharge status: Home / Self Care | Payer: Self-pay | Source: Ambulatory Visit | Attending: Primary Care | Admitting: Primary Care

## 2012-02-03 ENCOUNTER — Encounter: Payer: Self-pay | Admitting: Primary Care

## 2012-02-03 DIAGNOSIS — R7982 Elevated C-reactive protein (CRP): Secondary | ICD-10-CM

## 2012-02-04 LAB — ANTINUCLEAR ANTIBODY SCREEN: ANA Screen: POSITIVE — AB

## 2012-02-05 LAB — ANA TITER/PATTERN: ANA Titer: 160 — AB

## 2012-02-05 LAB — RHEUMATOID FACTOR,SCREEN: Rheumatoid Factor: <10 IU/mL

## 2012-02-06 ENCOUNTER — Encounter: Payer: Self-pay | Admitting: Primary Care

## 2012-02-11 ENCOUNTER — Encounter: Payer: Self-pay | Admitting: Primary Care

## 2012-02-11 ENCOUNTER — Ambulatory Visit: Payer: Self-pay | Admitting: Primary Care

## 2012-02-11 VITALS — BP 120/74 | HR 106 | Ht 61.5 in | Wt 135.0 lb

## 2012-02-11 DIAGNOSIS — R7982 Elevated C-reactive protein (CRP): Secondary | ICD-10-CM

## 2012-02-11 DIAGNOSIS — G43009 Migraine without aura, not intractable, without status migrainosus: Secondary | ICD-10-CM

## 2012-02-11 MED ORDER — PROMETHAZINE HCL 12.5 MG PO TABS *I*
12.5000 mg | ORAL_TABLET | ORAL | Status: DC | PRN
Start: 2012-02-11 — End: 2013-01-24

## 2012-02-11 MED ORDER — HYDROCODONE-ACETAMINOPHEN 5-325 MG PO TABS *I*
1.0000 | ORAL_TABLET | Freq: Four times a day (QID) | ORAL | Status: DC | PRN
Start: 2012-02-11 — End: 2012-03-15

## 2012-02-11 NOTE — Progress Notes (Signed)
Subjective:     Patient ID: Jasmine Nunez is a 41 y.o. female.    HPI  Jasmine Nunez is a 41 yo woman who comes in for f/u headache and elevated CRP  She has a terrible headache again.  Woke up with it today.  Gets light and sound sensitive.  Feels like she has lost her hearing.  Took amerge this AM which did take edege off.  Her headaches have been really flaring over the last month.  She has been started on OCPs in hopes that this would help with headache.  CRP was discovered by gyn.  Has had to switch OCPs 4 times since April.  Each have cuased problems like headache, exessive bleeding.  Her low back is very painful  Did a lot of activity yesterday.    Jasmine Nunez has Essential Hypercholesterolemia; Allergic Rhinitis; Vitamin D Deficiency; Migraine without aura and without status migrainosus, not intractable; Pollen-food allergy syndrome; Fibromyalgia; GERD (gastroesophageal reflux disease); Insomnia; and Elevated C-reactive protein (CRP) on her problem list.  Current Outpatient Prescriptions on File Prior to Visit   Medication Sig Dispense Refill   . omeprazole (PRILOSEC) 20 MG capsule Take 1 capsule (20 mg total) by mouth daily (before breakfast)  30 capsule  5   . naratriptan (AMERGE) 2.5 MG tablet TAKE ONE TABLET BY MOUTH AT THE ONSET OF HEADACHE, MAY REPEAT ONE TIME AFTER 4 HOURS AS NEEDED DO NOT EXCEED 5MG  IN 24 HOURS  9 tablet  5   . fluticasone (FLONASE) 50 MCG/ACT nasal spray 2 sprays by Nasal route daily  16 g  11   . Omega-3 Fatty Acids (FISH OIL) 1000 MG CAPS Take 1 capsule by mouth daily       . Multiple Vitamin tablet Take 1 tablet by mouth daily          . epinephrine (EPIPEN) 0.3 MG/0.3ML DEVI Inject 0.3 mg into the muscle once as needed           . cholecalciferol (VITAMIN D) 1000 UNIT tablet Take 1,000 Units by mouth daily             No current facility-administered medications on file prior to visit.     Jasmine Nunez is allergic to amitriptyline; environmental allergies; fluoxetine; and food allergy  formula.    Review of Systems          Objective:  BP 120/74  Pulse 106  Ht 1.562 m (5' 1.5")  Wt 61.236 kg (135 lb)  BMI 25.1 kg/m2  SpO2 99%     Physical Exam   Constitutional: She is oriented to person, place, and time. She appears well-developed and well-nourished. She appears distressed (tearful at times).   Cardiovascular: Normal rate and regular rhythm.    No murmur heard.  Pulmonary/Chest: Breath sounds normal.   Neurological: She is alert and oriented to person, place, and time. No cranial nerve deficit. She exhibits normal muscle tone. Coordination normal.             Assessment:      1. Elevated C-reactive protein (CRP)    2. Migraine without aura and without status migrainosus, not intractable               Plan:      Jasmine Nunez was seen today for follow-up and migraine.    Diagnoses and associated orders for this visit:    Elevated C-reactive protein (CRP)  In light of elevated CRP and positive ANA, referred to rheum urgently.  -  AMB REFERRAL TO RHEUMATOLOGY    Migraine without aura and without status migrainosus, not intractable  Concern for vasculitis given above labs.  Ordered MRI of brain.  rx promethazine for help with nausea and improved absorption of amerge.  rx hydrocodone for severe pain.    - MR head without and with contrast; Future  - promethazine (PHENERGAN) 12.5 MG tablet; Take 1 tablet (12.5 mg total) by mouth Q4-6H PRN for Nausea (headache)  - HYDROcodone-acetaminophen (NORCO) 5-325 MG per tablet; Take 1 tablet by mouth every 6 hours as needed for Pain   MDD 4 tablets  - MR head without and with contrast

## 2012-02-12 ENCOUNTER — Encounter: Payer: Self-pay | Admitting: Neurology

## 2012-02-12 MED ORDER — PREDNISONE 10 MG PO TABS *I*
ORAL_TABLET | ORAL | Status: DC
Start: 2012-02-12 — End: 2012-03-15

## 2012-02-13 ENCOUNTER — Encounter: Payer: Self-pay | Admitting: Primary Care

## 2012-03-04 ENCOUNTER — Encounter: Payer: Self-pay | Admitting: Primary Care

## 2012-03-04 ENCOUNTER — Encounter: Payer: Self-pay | Admitting: Neurology

## 2012-03-05 ENCOUNTER — Telehealth: Payer: Self-pay | Admitting: Primary Care

## 2012-03-05 DIAGNOSIS — G43009 Migraine without aura, not intractable, without status migrainosus: Secondary | ICD-10-CM

## 2012-03-05 DIAGNOSIS — R7982 Elevated C-reactive protein (CRP): Secondary | ICD-10-CM

## 2012-03-05 NOTE — Telephone Encounter (Signed)
Spoke to patient and advised her to make an appt with Dr. Latanya Presser  Has appt with rheum in February  Told to have labs drawn after she finishes prednisone  Now on magnesium, vit d  Stopped OCP  Has a copper IUD

## 2012-03-15 ENCOUNTER — Ambulatory Visit
Admit: 2012-03-15 | Discharge: 2012-03-15 | Disposition: A | Discharge status: Home / Self Care | Payer: Self-pay | Source: Ambulatory Visit | Attending: Neurology | Admitting: Neurology

## 2012-03-15 ENCOUNTER — Encounter: Payer: Self-pay | Admitting: Neurology

## 2012-03-15 ENCOUNTER — Ambulatory Visit: Payer: Self-pay | Admitting: Neurology

## 2012-03-15 VITALS — BP 152/76 | HR 129 | Ht 61.5 in | Wt 138.0 lb

## 2012-03-15 DIAGNOSIS — G43009 Migraine without aura, not intractable, without status migrainosus: Secondary | ICD-10-CM

## 2012-03-15 DIAGNOSIS — R768 Other specified abnormal immunological findings in serum: Secondary | ICD-10-CM | POA: Insufficient documentation

## 2012-03-15 DIAGNOSIS — R7982 Elevated C-reactive protein (CRP): Secondary | ICD-10-CM

## 2012-03-15 DIAGNOSIS — M797 Fibromyalgia: Secondary | ICD-10-CM

## 2012-03-15 LAB — CRP: CRP: 26 mg/L — ABNORMAL HIGH (ref 0–10)

## 2012-03-15 LAB — SEDIMENTATION RATE, AUTOMATED: Sedimentation Rate: 15 mm/hr (ref 0–20)

## 2012-03-15 LAB — MULTIPLE ORDERING DOCS

## 2012-03-15 MED ORDER — GABAPENTIN 300 MG PO CAPS
300.0000 mg | ORAL_CAPSULE | Freq: Two times a day (BID) | ORAL | Status: DC
Start: 2012-03-15 — End: 2012-04-24

## 2012-03-15 NOTE — Patient Instructions (Addendum)
   Start gabapentin 300 mg one capsule in the evening for one week, then increase to 1 capsule twice daily.   Get in touch with Dr. Latanya Presser in 2 weeks to let him know how things are going.  Get in touch with him sooner if you don't tolerate the medication.

## 2012-03-15 NOTE — Progress Notes (Signed)
I saw Jasmine Nunez in the [facility] clinic on 03/15/2012 for follow-up evaluation of migraine headaches.  I last saw her  on 12/08/2011.    HPI: She tells me that yesterday was a great day.  She did not turn on her humidifier overnight, and this morning was "really rough".  She felt like her head was going to blow off.  She was not sure if she had a sinus headache or a migraine.  She took acetaminophen.  She thinks that she has a headache almost every single day, though yesterday she had no headache or body aches.  Her body aches are fairly constant.  She doesn't take anything in particular for her body pains.  She is sleeping a lot better since she stopped her birth control, though she wakes up feeling rested only one day per week.  She wasn't able to get through the day without taking a nap until a couple of weeks ago.  She has been taking magnesium, but has not found riboflavin by itself.  She has not noticed any change since starting magnesium.      She has had sweats since she stopped taking the birth control.  She has not had fevers or chills.  She has gained weight.  She has tried to be strict about her calorie intake.  She was exercising regularly until she and her husband dropped their gym membership a few months ago.  She has not had any time to exercise.  She has not had any rash distinct from her chronic rosacea.  She had blood testing when she was in her 20's to test for lupus, and that all came back negative.  Her PCP has ordered repeat laboratory studies to be done today.  She thinks that her anxiety is better, and that it is typically associated with horrendous pain.  She has had some swelling of her face and limbs. She has not had any focal swelling, redness, or tenderness of her joints.  She has had a lot of anxiety about her blood work abnormalities and being told that she might have an autoimmune disease.  "I have been crying my eyes out" over the news.  She has felt that  everything     The active problem list, past medical/surgical history, medications, allergies, social history, and family history were reviewed with the patient and/or caregiver.  I documented/updated these history elements and marked them as reviewed in the appropriate sections of the electronic health record.    REVIEW OF SYSTEMS:  She completed a 15 point review of systems questionnaire that I reviewed today and that will be scanned into the electronic health record.     VITALS:  Filed Vitals:    03/15/12 0914   BP: 152/76   Pulse: 129   Height: 1.562 m (5' 1.5")   Weight: 62.596 kg (138 lb)   Body mass index is 25.66 kg/(m^2).     EXAMINATION:  No formal examination was performed today.     LABORATORY DATA:   (02/03/2012) ANA positive 1:160 with speckled pattern, rheumatoid factor < 10  (01/28/2012) CRP 64 (ref 0-10), platelets 270   (10/15/2009) ANA negative, rheumatoid factor < 10, IgA 211 (ref 70-400), Lyme Ab negative    IMAGING DATA: I reviewed the images of her head MRI without and with contrast performed 02/12/2012.  There is evidence of chronic sinus disease, otherwise this is a normal study.    IMPRESSION:   This is a 41 year old woman  with migraine headaches, chronic generalized pains diagnosed as fibromyalgia, non-restorative sleep with excessive daytime fatigue, and anxiety.  I spent 20 minutes with Mrs. Saxby this morning, more than 50% of which was spent in counseling and coordination of care.  Her migraine headaches and generalized pain are increased over the last few months.  Screening blood work performed by her gynecologist revealed a mildly elevated ANA titer and elevated CRP.  This prompted MR imaging of the head to evaluate for CNS vasculitis, and a referral to rheumatology.  Her history is not consistent with CNS vasculitis, and her MRI is normal.  The abnormalities on her laboratory testing are non-specific and are of uncertain significance in the absence of any clear concern for a specific  disorder.  She has had her current symptoms for a number of years.  Her PCP referred her for blood work in July 2011 (listed above) as part of an evaluation of symptoms of arthralgias and fatigue.  I suspect her evaluation for lupus when she was in her 20's also included these lab studies.  Unfortunately, she has experienced significant anxiety since she was told that she may have an autoimmune disorder.  I don't have any correspondence or records from her gynecologist, and I don't know what the specific concern was that prompted the lab studies.      I explained to Mrs. Virgo that I don't think that she has an inflammatory or autoimmune disorder.  I think that all of her headaches, even those that she thinks are related to sinus disease, are migraines and should be treated as such.  She has been reluctant to try any new medications for her symptoms.  I pointed out that she has frequent migraines, diffuse pain, and she is not sleeping well.  Unfortunately she has not exercised in several months and has gained weight as a result.  I suggested a trial of gabapentin and discussed with her potential side effects, including weight gain.  This medication could be effective for her migraine management as well as management of her fibromyalgia pains.  It may also improve her sleep.  She is interested in giving it a try.  In the meantime, she will have repeat blood work ordered by her PCP to check her CRP.  I am also adding a sedimentation rate and repeat ANA.    PLAN:   She will start gabapentin 300 mg one capsule nightly for one week, then increase to one capsule twice daily.   She will continue to use naratriptan for acute migraine management.    I would like to see her back in 3 months.  I would be happy to see her sooner if needed.    Azucena Kuba, MD  Asst. Professor of Neurology  [facility]  Office: [phone]  03/15/2012 8:53 AM

## 2012-03-17 ENCOUNTER — Encounter: Payer: Self-pay | Admitting: Primary Care

## 2012-03-17 ENCOUNTER — Encounter: Payer: Self-pay | Admitting: Neurology

## 2012-03-18 LAB — VITAMIN D
25-OH VIT D2: <4 ng/mL
25-OH VIT D3: 56 ng/mL
25-OH Vit Total: 56 ng/mL (ref 30–60)

## 2012-03-26 ENCOUNTER — Encounter: Payer: Self-pay | Admitting: Neurology

## 2012-03-26 LAB — ANTINUCLEAR ANTIBODY SCREEN: ANA Screen: NEGATIVE

## 2012-04-20 ENCOUNTER — Telehealth: Payer: Self-pay | Admitting: Neurology

## 2012-04-20 NOTE — Telephone Encounter (Signed)
Jasmine Nunez is calling about some side effects she may be having from Gabapentin. She felt fine the first few weeks but now still having migraines and depression. She wanted to know if it may have been from coming off of her birth control, her menstrual cycle or just a side effect of the meds.

## 2012-04-22 NOTE — Telephone Encounter (Signed)
Jasmine Nunez called back today stating that she is continuing to have migraine headaches. She asked that you please call her back to discuss the medications. She also wondered about a medication contradiction between the Gabapentin & Magnesium that she had been taking. She said that you could either call her or correspond through My Chart.

## 2012-04-23 ENCOUNTER — Encounter: Payer: Self-pay | Admitting: Neurology

## 2012-04-23 DIAGNOSIS — G43009 Migraine without aura, not intractable, without status migrainosus: Secondary | ICD-10-CM

## 2012-04-23 DIAGNOSIS — M797 Fibromyalgia: Secondary | ICD-10-CM

## 2012-04-24 ENCOUNTER — Encounter: Payer: Self-pay | Admitting: Neurology

## 2012-04-24 MED ORDER — GABAPENTIN 300 MG PO CAPS
600.0000 mg | ORAL_CAPSULE | Freq: Two times a day (BID) | ORAL | Status: DC
Start: 2012-04-24 — End: 2012-11-26

## 2012-04-24 NOTE — Telephone Encounter (Signed)
Jasmine Nunez communicated with my via MyChart and I responded to her message this morning.

## 2012-04-26 ENCOUNTER — Other Ambulatory Visit: Payer: Self-pay | Admitting: Primary Care

## 2012-04-26 DIAGNOSIS — K219 Gastro-esophageal reflux disease without esophagitis: Secondary | ICD-10-CM

## 2012-05-06 ENCOUNTER — Ambulatory Visit
Admit: 2012-05-06 | Discharge: 2012-05-06 | Disposition: A | Discharge status: Home / Self Care | Payer: Self-pay | Source: Ambulatory Visit | Attending: Family | Admitting: Family

## 2012-05-07 LAB — AEROBIC CULTURE: Aerobic Culture: 0

## 2012-05-07 LAB — VAGINITIS SCREEN: DNA PROBE: Vaginitis Screen:DNA Probe: POSITIVE

## 2012-05-14 ENCOUNTER — Encounter: Payer: Self-pay | Admitting: Rheumatology

## 2012-05-14 ENCOUNTER — Ambulatory Visit: Payer: Self-pay | Admitting: Rheumatology

## 2012-05-14 VITALS — BP 100/71 | HR 86 | Temp 98.6°F | Ht 61.25 in | Wt 139.6 lb

## 2012-05-14 DIAGNOSIS — M791 Myalgia, unspecified site: Secondary | ICD-10-CM

## 2012-05-14 DIAGNOSIS — R768 Other specified abnormal immunological findings in serum: Secondary | ICD-10-CM

## 2012-05-14 DIAGNOSIS — G43909 Migraine, unspecified, not intractable, without status migrainosus: Secondary | ICD-10-CM

## 2012-05-14 DIAGNOSIS — R7982 Elevated C-reactive protein (CRP): Secondary | ICD-10-CM

## 2012-05-14 LAB — CBC
Hematocrit: 41 % (ref 34–45)
Hemoglobin: 13.7 g/dL (ref 11.2–15.7)
MCV: 84 fL (ref 79–95)
Platelets: 249 10*3/uL (ref 160–370)
RBC: 4.9 MIL/uL (ref 3.9–5.2)
RDW: 13.4 % (ref 11.7–14.4)
WBC: 8.5 10*3/uL (ref 4.0–10.0)

## 2012-05-14 LAB — VITAMIN B12: Vitamin B12: 714 pg/mL (ref 211–946)

## 2012-05-14 LAB — TSH: TSH: 1.96 u[IU]/mL (ref 0.27–4.20)

## 2012-05-14 LAB — C4 COMPLEMENT: C4: 20 mg/dL (ref 10–40)

## 2012-05-14 LAB — FERRITIN: Ferritin: 33 ng/mL (ref 10–120)

## 2012-05-14 LAB — C3 COMPLEMENT: C3: 117 mg/dL (ref 90–180)

## 2012-05-14 LAB — SEDIMENTATION RATE, AUTOMATED: Sedimentation Rate: 10 mm/hr (ref 0–20)

## 2012-05-14 LAB — CRP: CRP: 3 mg/L (ref 0–10)

## 2012-05-14 LAB — CK: CK: 96 U/L (ref 34–145)

## 2012-05-17 LAB — SPEC COAG REVIEW

## 2012-05-17 LAB — ANTI DS-DNA AB: dsDNA Ab: <1 IU/mL (ref 0–4)

## 2012-05-17 LAB — ANTI-SSA/SSB
Anti-LA/SS-B: <0.2 AI (ref 0.0–0.9)
Anti-RO/SS-A: <0.2 AI (ref 0.0–0.9)

## 2012-05-17 LAB — ANTI RNP/SMITH
Anti-RNP: <0.2 AI (ref 0.0–0.9)
Anti-Smith: <0.2 AI (ref 0.0–0.9)
SM/RNP AB: <0.2 AI (ref 0.0–0.9)

## 2012-05-17 LAB — LUPUS ANTICOAGULANT: Lupus Anticoagulant: NEGATIVE

## 2012-05-17 LAB — REVIEWED BY:

## 2012-05-17 LAB — INTERPRETATION,SPEC COAG

## 2012-05-17 LAB — TSH RECEPTOR AB: TSH Receptor AB: <0.9 IU/L (ref <=1.75)

## 2012-05-17 NOTE — Progress Notes (Addendum)
Reason for visit: Evaluation of musculoskeletal discomfort, elevation of CRP and variable titers of ANA.    History of present illness: 42 year old female with a long-standing history of migraine headaches.  She reports a two-year history of widespread musculoskeletal discomfort.  She has tried gabapentin which has been helpful so far.  She reports "episodes of feeling unwell".  Episodes of exhaustion.  She initially tried Prozac, this "made her more depressed".  She has a poor nonrestorative sleep per her report.  She wakes up not feeling rested.  She reports a history of a rash in her early 64s.  At that time, she tested "negative for an autoimmune disease".    Past surgical history: Knee surgery in about 1986, removal of IUD in 2009.    Past medical history: Migraine type headaches, sinus infections, rosacea, back pain.  2 pregnancies, no miscarriages.    Family history: She reports Sjogren's syndrome in her mother, colitis in a grandmother, no other family history of autoimmune disease or inflammatory arthritis.    Social history: She is working, married, nonsmoker, 1 alcoholic beverage per week on average, she exercises regularly.  She has not traveled out of the country in the past 12 months.    Review of systems: Fatigue, headaches, muscle weakness, dizzy spells, hair loss, rosacea, muscle stiffness, muscle pain, back pain, dry eyes, ringing in the ears, hearing change, nasal congestion, dry mouth, history of oral ulcers, heartburn, nausea, abdominal discomfort, constipation or diarrhea, anxiety, depressed mood and difficulty staying asleep.  Otherwise negative x10.    Physical examination: Well-developed, well-nourished patient in no acute distress.  The mouth is clear.  There are no ulcerations appreciated.  No enlarged cervical, supraclavicular, infraclavicular or axillary lymph nodes are palpable.  Chest is clear to auscultation.  Heart S1, S2.  Extremity examination shows no synovitis in the DIP joints,  PIP joints, MCP joints or wrists.  Elbows and shoulders without palpable fluid collection.  Knees have no palpable effusion.  Ankles have no edema.  Compression of the MTP joints is not tender.  Skin examination without rash.  Tender point examination for fibromyalgia is currently negative.    Results: ANA screening most recently negative.  In the past, she was found to have a titer of 160.  CRP has been above normal limits in the past.    Impression: History of an elevated CRP of unclear clinical significance.  I could not find enough evidence for inflammatory arthritis or connective tissue disease at this point.  Abnormal antibody in the past of unclear clinical significance.  Most recently negative.  History of migraine headaches, theoretically, positive ANA titers could be seen in the antiphospholipid antibody syndrome.  She does not have corroborating evidence of such as history of frequent miscarriages or DVTs.    Plan: I will send her for additional serology.  This will also include anti-Smith antibodies and the lupus anticoagulant.  We will obtain extractable nuclear antigens and complements.  Inflammatory markers will be repeated.  She will followup shortly for a discussion.        This note was dictated using voice recognition software. Reasonable attempts at proofreading were made, but misspellings may persist.

## 2012-05-18 LAB — ANTI-CARDIOLIPIN AB
Anticardiolipin IgG: 3 [GPL'U]/mL (ref 0–14)
Anticardiolipin IgM: 10 [MPL'U]/mL (ref 0–12)

## 2012-05-19 ENCOUNTER — Encounter: Payer: Self-pay | Admitting: Neurology

## 2012-05-20 LAB — THYROID ANTIBODIES
Thyroglobulin Ab: <20 IU/mL (ref 0–40)
Thyroid Peroxidase Ab: <5 IU/mL (ref 0–33)

## 2012-05-25 ENCOUNTER — Encounter: Payer: Self-pay | Admitting: Primary Care

## 2012-05-25 ENCOUNTER — Ambulatory Visit: Payer: Self-pay | Admitting: Primary Care

## 2012-05-25 VITALS — BP 110/74 | HR 87 | Temp 97.9°F | Ht 61.25 in | Wt 140.0 lb

## 2012-05-25 DIAGNOSIS — J329 Chronic sinusitis, unspecified: Secondary | ICD-10-CM

## 2012-05-25 MED ORDER — AMOXICILLIN 875 MG PO TABS *I*
875.0000 mg | ORAL_TABLET | Freq: Two times a day (BID) | ORAL | Status: DC
Start: 2012-05-25 — End: 2012-12-01

## 2012-05-25 NOTE — Progress Notes (Signed)
Subjective:     Patient ID: Jasmine Nunez is a 42 y.o. female.    HPI  Jasmine Nunez is a 42 year old woman who comes in today with the complaint of congestion  Had fever, congestion fatigue over the weekend.  Still feels very congested.  Tried nyquil, airborne.  Did have migraines last week.  No cough.  Sx's come and go.    Jasmine Nunez has Essential Hypercholesterolemia; Allergic Rhinitis; Vitamin D Deficiency; Migraine without aura and without status migrainosus, not intractable; Pollen-food allergy syndrome; Fibromyalgia; GERD (gastroesophageal reflux disease); Insomnia; and Elevated C-reactive protein (CRP) on her problem list.  Current Outpatient Prescriptions on File Prior to Visit   Medication Sig Dispense Refill   . gabapentin (NEURONTIN) 300 MG capsule Take 2 capsules (600 mg total) by mouth 2 times daily  120 capsule  5   . acetaminophen (TYLENOL) 500 mg tablet Take 1,000 mg by mouth 3 times daily as needed       . promethazine (PHENERGAN) 12.5 MG tablet Take 1 tablet (12.5 mg total) by mouth Q4-6H PRN for Nausea (headache)  30 tablet  0   . omeprazole (PRILOSEC) 20 MG capsule Take 1 capsule (20 mg total) by mouth daily (before breakfast)  30 capsule  5   . naratriptan (AMERGE) 2.5 MG tablet TAKE ONE TABLET BY MOUTH AT THE ONSET OF HEADACHE, MAY REPEAT ONE TIME AFTER 4 HOURS AS NEEDED DO NOT EXCEED 5MG  IN 24 HOURS  9 tablet  5   . fluticasone (FLONASE) 50 MCG/ACT nasal spray 2 sprays by Nasal route daily  16 g  11   . Omega-3 Fatty Acids (FISH OIL) 1000 MG CAPS Take 1 capsule by mouth daily       . Multiple Vitamin tablet Take 1 tablet by mouth daily          . epinephrine (EPIPEN) 0.3 MG/0.3ML DEVI Inject 0.3 mg into the muscle once as needed           . cholecalciferol (VITAMIN D) 1000 UNIT tablet Take 1,000 Units by mouth daily           . loratadine (CLARITIN) 10 MG tablet Take 10 mg by mouth daily         No current facility-administered medications on file prior to visit.     Jasmine Nunez is allergic to amitriptyline;  environmental allergies; fluoxetine; food allergy formula; and hydrocodone.    Review of Systems   Respiratory: Negative for cough.              Objective:  BP 110/74  Pulse 87  Temp(Src) 36.6 C (97.9 F) (Temporal)  Ht 1.556 m (5' 1.25")  Wt 63.504 kg (140 lb)  BMI 26.23 kg/m2  SpO2 99%     Physical Exam   Constitutional: She appears well-developed and well-nourished. No distress.   HENT:   Right Ear: Tympanic membrane normal.   Left Ear: Tympanic membrane normal.   Nose: Right sinus exhibits maxillary sinus tenderness. Right sinus exhibits no frontal sinus tenderness. Left sinus exhibits maxillary sinus tenderness. Left sinus exhibits no frontal sinus tenderness.   Mouth/Throat: Oropharynx is clear and moist.   Cardiovascular: Normal rate and regular rhythm.    No murmur heard.  Pulmonary/Chest: Breath sounds normal.   Lymphadenopathy:     She has no cervical adenopathy.             Assessment:      1. Sinusitis  Plan:      Jasmine Nunez was seen today for facial pain, headache and nasal congestion.    Diagnoses and associated orders for this visit:    Sinusitis  Likely secondary to influenza.  It appears the influenza has resolved.  Treated for sinusitis with amoxicillin twice daily for 10 days.  - amoxicillin (AMOXIL) 875 MG tablet; Take 1 tablet (875 mg total) by mouth 2 times daily

## 2012-05-30 ENCOUNTER — Other Ambulatory Visit: Payer: Self-pay | Admitting: Neurology

## 2012-06-01 ENCOUNTER — Encounter: Payer: Self-pay | Admitting: Primary Care

## 2012-06-08 ENCOUNTER — Encounter: Payer: Self-pay | Admitting: Rheumatology

## 2012-06-08 ENCOUNTER — Ambulatory Visit: Payer: Self-pay | Admitting: Rheumatology

## 2012-06-08 VITALS — BP 122/67 | HR 90 | Temp 98.0°F | Ht 61.25 in | Wt 139.2 lb

## 2012-06-08 DIAGNOSIS — R768 Other specified abnormal immunological findings in serum: Secondary | ICD-10-CM

## 2012-06-08 DIAGNOSIS — R7982 Elevated C-reactive protein (CRP): Secondary | ICD-10-CM

## 2012-06-08 NOTE — Progress Notes (Signed)
Reason for visit: History of positive ANA and elevated CRP.  Evaluate for rheumatic condition.    History of present illness: 42 year old female who has a long-standing history of migraine type headaches.  Now she has a 2 year history of widespread musculoskeletal discomfort.  She is taking gabapentin which is helping her migraine headaches to some degree and has helped her musculoskeletal discomfort significantly.  She was found to have a positive ANA titer on one occasion, negative ANA titer more recently.  Her CRP has been elevated in the past.  He reports episodes of feeling unwell, she has another episode like this since I had seen her last.  She has no other features of lupus such as a photosensitive rash, Raynaud phenomenon or livedo reticularis.    Review of systems: Fatigue, myalgias, back pain, neck pain, hair change in nails change, eye pain, dry eyes, dry mouth, episodes of sores in the nose and sinus congestion, year pain, change in hearing, chest discomfort, heartburn, abdominal discomfort, diarrhea or constipation, sinusitis, headaches, change in memory and concentration, anxiety, difficulty falling asleep, feeling exhausted upon awakening.  Otherwise negative x10.    Social history: Nonsmoker, 1 drink per week on average.  She tries to exercise 2-3 times per week.    Physical examination: Well-developed, well-nourished patient in no acute distress.  The mouth is clear.  There are no ulcerations appreciated.  No enlarged cervical, supraclavicular, infraclavicular or axillary lymph nodes are palpable.  Chest is clear to auscultation.  Heart S1, S2.  Extremity examination shows no synovitis in the DIP joints, PIP joints, MCP joints or wrists.  Elbows and shoulders without palpable fluid collection.  Knees have no palpable effusion.  Ankles have no edema.  Compression of the MTP joints is not tender.  Skin examination without rash.    Results: Most recent ANA titer negative.  Most recent CRP within  normal limits.  The remainder of her autoimmune serology entirely within normal limits.  Negative for lupus anticoagulant and negative anticardiolipin antibodies.  Negative thyroid antibodies.  CBC within normal limits.  Complements within normal limits.  Sedimentation rate within normal limits.  CK, ferritin vitamin B 12 within normal limits.    Impression: Currently no evidence for a rheumatic condition, a serologic workup was negative.  She has migraine headaches and is followed by neurology.    Plan: I reassured her regarding her condition.  I offered her to followup here on an as-needed basis if she has more symptoms that require the attention of the rheumatologist.        This note was dictated using voice recognition software. Reasonable attempts at proofreading were made, but misspellings may persist.

## 2012-06-28 ENCOUNTER — Ambulatory Visit
Admit: 2012-06-28 | Discharge: 2012-06-28 | Disposition: A | Discharge status: Home / Self Care | Payer: Self-pay | Source: Ambulatory Visit | Attending: Obstetrics | Admitting: Obstetrics

## 2012-06-28 LAB — HM PAP SMEAR

## 2012-07-02 LAB — HPV DNA PROBE WITH CYTOLOGY: HPV Hybrid Capture: NEGATIVE

## 2012-07-03 LAB — GYN CYTOLOGY

## 2012-07-17 ENCOUNTER — Telehealth: Payer: Self-pay | Admitting: Primary Care

## 2012-07-17 MED ORDER — OSELTAMIVIR PHOSPHATE 75 MG PO CAPS *I*
75.0000 mg | ORAL_CAPSULE | Freq: Two times a day (BID) | ORAL | Status: AC
Start: 2012-07-17 — End: 2012-07-22

## 2012-07-17 MED ORDER — OSELTAMIVIR PHOSPHATE 75 MG PO CAPS *I*
75.0000 mg | ORAL_CAPSULE | Freq: Two times a day (BID) | ORAL | Status: DC
Start: 2012-07-17 — End: 2012-07-17

## 2012-07-17 NOTE — Telephone Encounter (Signed)
On call Dr Liston Alba   Pt fever cough body ache   Feel it is influenza   Request tamiflu  Symptoms started 2 days ago

## 2012-07-20 ENCOUNTER — Encounter: Payer: Self-pay | Admitting: Primary Care

## 2012-07-21 ENCOUNTER — Encounter: Payer: Self-pay | Admitting: Primary Care

## 2012-07-21 ENCOUNTER — Emergency Department: Admit: 2012-07-21 | Disposition: A | Discharge status: Home / Self Care | Payer: Self-pay | Source: Ambulatory Visit

## 2012-07-21 ENCOUNTER — Ambulatory Visit: Payer: Self-pay | Admitting: Primary Care

## 2012-07-21 VITALS — BP 102/70 | HR 120 | Temp 100.0°F | Ht 61.25 in

## 2012-07-21 LAB — CBC AND DIFFERENTIAL
Baso # K/uL: 0 10*3/uL (ref 0.0–0.1)
Basophil %: 0.2 % (ref 0.1–1.2)
Eos # K/uL: 0 10*3/uL (ref 0.0–0.4)
Eosinophil %: 0.2 % — ABNORMAL LOW (ref 0.7–5.8)
Hematocrit: 40 % (ref 34–45)
Hemoglobin: 13.3 g/dL (ref 11.2–15.7)
Lymph # K/uL: 1.9 10*3/uL (ref 1.2–3.7)
Lymphocyte %: 19.3 % (ref 19.3–51.7)
MCV: 83 fL (ref 79–95)
Mono # K/uL: 0.8 10*3/uL (ref 0.2–0.9)
Monocyte %: 8.4 % (ref 4.7–12.5)
Neut # K/uL: 6.9 10*3/uL — ABNORMAL HIGH (ref 1.6–6.1)
Platelets: 210 10*3/uL (ref 160–370)
RBC: 4.8 MIL/uL (ref 3.9–5.2)
RDW: 13.4 % (ref 11.7–14.4)
Seg Neut %: 71.1 % (ref 34.0–71.1)
WBC: 9.7 10*3/uL (ref 4.0–10.0)

## 2012-07-21 LAB — RUQ PANEL (ED ONLY)
ALT: 34 U/L (ref 0–35)
AST: 40 U/L — ABNORMAL HIGH (ref 0–35)
Albumin: 4 g/dL (ref 3.5–5.2)
Alk Phos: 153 U/L — ABNORMAL HIGH (ref 35–105)
Amylase: 78 U/L (ref 28–100)
Bilirubin,Direct: <0.2 mg/dL (ref 0.0–0.3)
Bilirubin,Total: 0.4 mg/dL (ref 0.0–1.2)
Globulin: 3.6 g/dL (ref 2.7–4.3)
Lipase: 31 U/L (ref 13–60)
Total Protein: 7.6 g/dL (ref 6.3–7.7)

## 2012-07-21 LAB — BASIC METABOLIC PANEL
Anion Gap: 12 (ref 7–16)
CO2: 25 mmol/L (ref 20–28)
Calcium: 9.4 mg/dL (ref 8.8–10.2)
Chloride: 98 mmol/L (ref 96–108)
Creatinine: 0.63 mg/dL (ref 0.51–0.95)
GFR,Black: 130 *
GFR,Caucasian: 113 *
Glucose: 112 mg/dL — ABNORMAL HIGH (ref 60–99)
Lab: 13 mg/dL (ref 6–20)
Potassium: 3.6 mmol/L (ref 3.3–5.1)
Sodium: 135 mmol/L (ref 133–145)

## 2012-07-21 LAB — POCT URINALYSIS DIPSTICK
Glucose,UA POCT: NORMAL
Leuk Esterase,UA POCT: NEGATIVE
Lot #: 22313202
Nitrite,UA POCT: NEGATIVE
PH,UA POCT: 9 (ref 5–8)

## 2012-07-21 LAB — POCT URINE PREGNANCY: Lot #: 219452

## 2012-07-21 LAB — URINALYSIS REFLEX TO CULTURE
Leuk Esterase,UA: NEGATIVE
Nitrite,UA: NEGATIVE
Protein,UA: 25 mg/dL — AB
Specific Gravity,UA: 1.026 (ref 1.001–1.030)
pH,UA: 9 — ABNORMAL HIGH (ref 5.0–8.0)

## 2012-07-21 LAB — RAPID STREP SCREEN: Rapid Strep Group A Throat: POSITIVE

## 2012-07-21 LAB — URINE MICROSCOPIC (IQ200): WBC,UA: NONE SEEN /hpf (ref 0–5)

## 2012-07-21 MED ORDER — KETOROLAC TROMETHAMINE 30 MG/ML IJ SOLN *I*
30.0000 mg | Freq: Once | INTRAMUSCULAR | Status: AC
Start: 2012-07-21 — End: 2012-07-21
  Administered 2012-07-21: 30 mg via INTRAVENOUS
  Filled 2012-07-21: qty 1

## 2012-07-21 MED ORDER — SODIUM CHLORIDE 0.9 % IV BOLUS *I*
1000.0000 mL | Freq: Once | Status: AC
Start: 2012-07-21 — End: 2012-07-21
  Administered 2012-07-21: 1000 mL via INTRAVENOUS

## 2012-07-21 MED ORDER — ONDANSETRON HCL 2 MG/ML IV SOLN *I*
INTRAMUSCULAR | Status: AC
Start: 2012-07-21 — End: 2012-07-21
  Filled 2012-07-21: qty 2

## 2012-07-21 MED ORDER — ACETAMINOPHEN 325 MG PO TABS *I*
650.0000 mg | ORAL_TABLET | Freq: Once | ORAL | Status: AC
Start: 2012-07-21 — End: 2012-07-21
  Administered 2012-07-21: 650 mg via ORAL
  Filled 2012-07-21: qty 2

## 2012-07-21 MED ORDER — ONDANSETRON HCL 4 MG PO TABS *I*
4.0000 mg | ORAL_TABLET | Freq: Three times a day (TID) | ORAL | Status: DC | PRN
Start: 2012-07-21 — End: 2012-12-01

## 2012-07-21 MED ORDER — ONDANSETRON HCL 2 MG/ML IV SOLN *I*
4.0000 mg | Freq: Once | INTRAMUSCULAR | Status: AC
Start: 2012-07-21 — End: 2012-07-21

## 2012-07-21 MED ORDER — PENICILLIN V POTASSIUM 500 MG PO TABS *I*
500.0000 mg | ORAL_TABLET | Freq: Two times a day (BID) | ORAL | Status: AC
Start: 2012-07-21 — End: 2012-07-31

## 2012-07-21 MED ORDER — PENICILLIN V POTASSIUM 250 MG PO TABS *I*
ORAL_TABLET | ORAL | Status: AC
Start: 2012-07-21 — End: 2012-07-21
  Filled 2012-07-21: qty 2

## 2012-07-21 MED ORDER — PENICILLIN V POTASSIUM 250 MG PO TABS *I*
500.0000 mg | ORAL_TABLET | Freq: Once | ORAL | Status: AC
Start: 2012-07-21 — End: 2012-07-21

## 2012-07-21 MED ORDER — ONDANSETRON HCL 2 MG/ML IV SOLN *I*
4.0000 mg | Freq: Once | INTRAMUSCULAR | Status: AC
Start: 2012-07-21 — End: 2012-07-21
  Administered 2012-07-21: 4 mg via INTRAVENOUS
  Filled 2012-07-21: qty 2

## 2012-07-21 MED ADMIN — Penicillin V Potassium Tab 250 MG: 500 mg | ORAL | NDC 67253020010

## 2012-07-21 MED ADMIN — Ondansetron HCl Inj 4 MG/2ML (2 MG/ML): 4 mg | INTRAVENOUS | NDC 00409475503

## 2012-07-21 NOTE — ED Provider Notes (Signed)
History     Chief Complaint   Patient presents with   . Fever     pt c/o fever, cough, sore throat and diarrhea with vomiting since thursday. pt was sent here by PCP     HPI Comments: 42 year old female with a PMHX of GERD presents with fever, cough, N/V and sore throat. Reports sx began 1 week ago with fever, cough and body aches.  Called PCP and began tamiflu. Mildly productive cough, mostly clear, occassionally yellow. Reports running nose, congestion, mucus clear.  C/o sore throat. Reports nausea and diarrhea began last night. Started vomiting this morning. Reports able to keep ice chips and gingerale down today.  States she feels dehydrated. Reports lower back pain/ ache. Denies abd pain, dysuria, CP. Reports frequency and urgency. Reports headache in frontal region. Denies neck pain. Has tried tylenol cold medicine, regular tylenol  dayquil, and claritin D with minimal relief. Last dose of tylenol was at 2 pm.      History provided by:  Patient  Language interpreter used: No        Past Medical History   Diagnosis Date   . GERD (gastroesophageal reflux disease)    . Allergic rhinitis             Past Surgical History   Procedure Laterality Date   . Knee surgery  1986     right, for benign bony overgrowth   . Iud removal         Family History   Problem Relation Age of Onset   . Conversion Other      [id number] Artery [id number]^Active^mid 50s   . Asthma Mother    . Heart failure Mother    . Diabetes Mother    . Asthma Sister    . Allergic rhinitis Sister    . Migraines Sister    . Cancer Maternal Grandmother    . Heart failure Maternal Grandmother    . Diabetes Maternal Grandmother    . Neuropathy Mother    . Alzheimer's disease Other      aunt   . Depression Maternal Grandmother          Social History      reports that she has never smoked. She has never used smokeless tobacco. She reports that she drinks about 0.6 ounces of alcohol per week. She reports that she currently engages in sexual  activity. She reports using the following method of birth control/protection: IUD. She reports that she does not use illicit drugs.    Living Situation    Questions Responses    Patient lives with     Homeless     Caregiver for other family member     External Services     Employment     Domestic Violence Risk           Review of Systems   Review of Systems   Constitutional: Positive for fever and chills.   HENT: Positive for congestion, sore throat and rhinorrhea.    Eyes: Positive for photophobia. Negative for redness.   Respiratory: Positive for cough and shortness of breath.    Cardiovascular: Negative for chest pain.   Gastrointestinal: Positive for nausea, vomiting and diarrhea. Negative for abdominal pain.   Endocrine: Negative for polyuria.   Genitourinary: Positive for urgency and frequency. Negative for dysuria.   Musculoskeletal: Negative for back pain.   Skin: Negative for wound.   Allergic/Immunologic: Negative for immunocompromised state.   Neurological: Positive  for headaches. Negative for seizures.   Psychiatric/Behavioral: Negative for behavioral problems.       Physical Exam     ED Triage Vitals   BP Heart Rate Heart Rate(via Pulse Ox) Resp Temp Temp Source SpO2 O2 Device O2 Flow Rate   07/21/12 1657 07/21/12 1657 -- 07/21/12 1657 07/21/12 1657 07/21/12 1748 07/21/12 1657 07/21/12 1657 --   108/66 mmHg 106  18 38.2 C (100.8 F) TEMPORAL 100 % None (Room air)       Weight           07/21/12 1657           61.689 kg (136 lb)               Physical Exam   Nursing note and vitals reviewed.  Constitutional: She is oriented to person, place, and time. She appears well-developed and well-nourished.   Pt appears uncomfortable, warm to the touch   HENT:   Head: Atraumatic.   Nose: Nose normal.   Mouth/Throat: Mucous membranes are dry. Oropharyngeal exudate and posterior oropharyngeal erythema present.   Eyes: Conjunctivae and EOM are normal. Pupils are equal, round, and reactive to light.   Neck: Normal  range of motion. Neck supple. No tracheal deviation present.   Cardiovascular: Normal rate, regular rhythm and normal heart sounds.    Pulmonary/Chest: Effort normal and breath sounds normal. No stridor. No respiratory distress. She has no wheezes. She has no rales. She exhibits no tenderness.   Abdominal: Soft. Bowel sounds are normal. She exhibits no distension. There is no tenderness. There is no rebound and no guarding.   Musculoskeletal: She exhibits no edema.   Lymphadenopathy:     She has no cervical adenopathy.   Neurological: She is alert and oriented to person, place, and time. No cranial nerve deficit.   Skin: Skin is warm and dry. She is not diaphoretic.   Psychiatric: She has a normal mood and affect. Her behavior is normal. Judgment and thought content normal.       Medical Decision Making      Amount and/or Complexity of Data Reviewed  Clinical lab tests: ordered and reviewed        Initial Evaluation:  ED First Provider Contact    Date/Time Event User Comments    07/21/12 1835 ED Provider First Contact Scot Jun Initial Face to Face Provider Contact          Patient seen by me as above    Assessment:  42 y.o., female comes to the ED with N/V and sore throat    Differential Diagnosis includes influenza, pharyngitis, gastroenteritis, dehydration, electrolyte abnormalities, pancreatitis              Plan: CBC  BMP  RUQ panel  UA  Rapid strep  IVF  Antiemetic  Analgesic  Tylenol    Dispo: Pt feeling better, headache improved, body aches decreased. Strep positive, discharge with Pen VK. Follow up with PCP      [place] Crosby, PA    Scot Jun [place], [place]  07/22/12 [phone]

## 2012-07-21 NOTE — Progress Notes (Signed)
Subjective:     Patient ID: Jasmine Nunez is a 42 y.o. female.    HPI  Jasmine Nunez is a 42 yo woman who comesin with c/o fever  Started with abrupt onset of fever, chills, myalgias    Last night had abrupt nausea, abd pain, diarrhea.  Has had 6 BMs since last night.  Fever is back.  Last vomited just before the visit.  Still feels SOB.  Has a severe frontal h/a today.      Jasmine Nunez has Essential Hypercholesterolemia; Allergic Rhinitis; Vitamin D Deficiency; Migraine without aura and without status migrainosus, not intractable; Pollen-food allergy syndrome; Fibromyalgia; GERD (gastroesophageal reflux disease); Insomnia; and Elevated C-reactive protein (CRP) on her problem list.  Current Outpatient Prescriptions on File Prior to Visit   Medication Sig Dispense Refill   . oseltamivir (TAMIFLU) 75 MG capsule Take 1 capsule (75 mg total) by mouth 2 times daily for 5 days  10 capsule  0   . naratriptan (AMERGE) 2.5 MG tablet TAKE ONE TABLET BY MOUTH AT THE ONSET OF HEADACHE. MAY REPEAT ONE TIME AFTER 4 HOURS AS NEEDED. DO NOT EXCEEDED 5MG  IN 24 HOURS.  9 tablet  5   . amoxicillin (AMOXIL) 875 MG tablet Take 1 tablet (875 mg total) by mouth 2 times daily  20 tablet  0   . gabapentin (NEURONTIN) 300 MG capsule Take 2 capsules (600 mg total) by mouth 2 times daily  120 capsule  5   . acetaminophen (TYLENOL) 500 mg tablet Take 1,000 mg by mouth 3 times daily as needed       . loratadine (CLARITIN) 10 MG tablet Take 10 mg by mouth daily       . promethazine (PHENERGAN) 12.5 MG tablet Take 1 tablet (12.5 mg total) by mouth Q4-6H PRN for Nausea (headache)  30 tablet  0   . omeprazole (PRILOSEC) 20 MG capsule Take 1 capsule (20 mg total) by mouth daily (before breakfast)  30 capsule  5   . fluticasone (FLONASE) 50 MCG/ACT nasal spray 2 sprays by Nasal route daily  16 g  11   . Omega-3 Fatty Acids (FISH OIL) 1000 MG CAPS Take 1 capsule by mouth daily       . Multiple Vitamin tablet Take 1 tablet by mouth daily          . epinephrine  (EPIPEN) 0.3 MG/0.3ML DEVI Inject 0.3 mg into the muscle once as needed           . cholecalciferol (VITAMIN D) 1000 UNIT tablet Take 1,000 Units by mouth daily             No current facility-administered medications on file prior to visit.     Jasmine Nunez is allergic to amitriptyline; environmental allergies; fluoxetine; food allergy formula; and hydrocodone.    Review of Systems          Objective:   Physical Exam   Constitutional: She appears well-developed and well-nourished. She appears distressed.   Lying on exam table.  toxic appearance   HENT:   Right Ear: Tympanic membrane normal.   Left Ear: Tympanic membrane normal.   Mouth/Throat: Oropharyngeal exudate, posterior oropharyngeal edema and posterior oropharyngeal erythema present. No tonsillar abscesses.   Cardiovascular: Regular rhythm and normal heart sounds.  Tachycardia present.    Pulmonary/Chest: Breath sounds normal.   Lymphadenopathy:     She has no cervical adenopathy.             Assessment:  1. Fever               Plan:      Patient is quite ill appearing and unable to keep down fluids.  Contacted husband to transport her to hospital for further workup.  Diff dx includes pneumonia, strep, viral gastroenteritis

## 2012-07-21 NOTE — Discharge Instructions (Signed)
Increase fluids, increase rest.  May use tylenol 4 times daily for fever.  Do not exceed 3000mg  daily.  May use motrin in btw doses of tylenol for fever and discomfort.  May use salt water gargles as needed for throat.  Follow up with your primary doctor.  Return to ER if symptoms worsen. Eat a bland diet. Start slow with soups, crackers and toast.

## 2012-07-22 ENCOUNTER — Encounter: Payer: Self-pay | Admitting: Urgent Care

## 2012-07-23 ENCOUNTER — Telehealth: Payer: Self-pay | Admitting: Primary Care

## 2012-07-23 NOTE — Telephone Encounter (Signed)
Dr. Porfirio Mylar spoke with [facility] this morning.

## 2012-07-23 NOTE — Telephone Encounter (Signed)
Spoke to [employer].  She feels like her arthritis is flaring and she has a headache this morning.  She feels a little nauseous.  Took Tylenol cold without relief.  Advised her that it's only been less than 48 hours since she's been on antibiotic.  Told her to give the antibiotic another day to work.  Advised her to use ibuprofen or Aleve for the headache

## 2012-07-23 NOTE — Telephone Encounter (Signed)
Pt calling has a sinus infection and the medication is not helping that she is on. No appts left with Dr.cicilline. Would like to know what to do. Would like to come in same time as daughter if possible (see message for [facility])

## 2012-08-11 ENCOUNTER — Other Ambulatory Visit: Payer: Self-pay | Admitting: Primary Care

## 2012-08-11 DIAGNOSIS — R928 Other abnormal and inconclusive findings on diagnostic imaging of breast: Secondary | ICD-10-CM

## 2012-08-16 ENCOUNTER — Ambulatory Visit
Admit: 2012-08-16 | Discharge: 2012-08-16 | Disposition: A | Discharge status: Home / Self Care | Payer: Self-pay | Source: Ambulatory Visit | Attending: Primary Care | Admitting: Primary Care

## 2012-08-20 ENCOUNTER — Encounter: Payer: Self-pay | Admitting: Primary Care

## 2012-11-26 ENCOUNTER — Telehealth: Payer: Self-pay | Admitting: Neurology

## 2012-11-26 DIAGNOSIS — G43009 Migraine without aura, not intractable, without status migrainosus: Secondary | ICD-10-CM

## 2012-11-26 DIAGNOSIS — G43901 Migraine, unspecified, not intractable, with status migrainosus: Secondary | ICD-10-CM

## 2012-11-26 MED ORDER — PREDNISONE 10 MG PO TABS *I*
ORAL_TABLET | ORAL | Status: DC
Start: 2012-11-26 — End: 2012-12-01

## 2012-11-26 NOTE — Telephone Encounter (Signed)
I spoke to Jasmine Nunez.  She is on day 3 of a migraine.  She has been taking ibuprofen, naratriptan, and acetaminophen.  Today was the worst day.  She saw a chiropractor and is feeling a little better.  I suggested that she take her promethazine tonight, and hopefully that and sleep will knock it out. If she has a migraine tomorrow, I sent a prescription for prednisone to her pharmacy that she can fill to hopefully knock out the status migrainosus.

## 2012-11-26 NOTE — Telephone Encounter (Signed)
Pt states that she has bad migraine and will not go away. Please call to discuss.

## 2012-11-30 ENCOUNTER — Telehealth: Payer: Self-pay | Admitting: Allergy and Immunology

## 2012-11-30 NOTE — Telephone Encounter (Addendum)
I would be curious to find out why she is hesitant to come back in.  She can try another OTC medication such as Zyrtec or Allegra (or generic equivalent) for eye and nasal symptoms.  If nasal symptoms are most bothersome OTC Nasacort spray may also be helpful.  She should discuss either of these options with her PCP or local pharmacist, if not coming in to see me.  If she is having oral symptoms with foods, or any breathing symptoms she should certainly come in to see [place].

## 2012-11-30 NOTE — Telephone Encounter (Signed)
Patient hasn't been since 2012 by Dr. Berna Spare, I spoke with her today as she called she is having a flare of her allergies but is hesitant to come in for an appointment. She would just like advice on what OTC medications she can use as se is currently on Claritin without relief. I advised that she should schedule an appointment to be seen as she hasn't been seen and it is in her best interest health wise so that she can receive accurate recommendations or treatment based on her needs.  I let her know I would relay the message to get your input and I would touch base with her after I hear back. Please advise Thanks!

## 2012-12-01 ENCOUNTER — Ambulatory Visit: Payer: Self-pay | Admitting: Primary Care

## 2012-12-01 ENCOUNTER — Encounter: Payer: Self-pay | Admitting: Primary Care

## 2012-12-01 VITALS — BP 100/64 | HR 95 | Temp 98.5°F | Ht 62.0 in | Wt 134.0 lb

## 2012-12-01 DIAGNOSIS — J309 Allergic rhinitis, unspecified: Secondary | ICD-10-CM

## 2012-12-01 DIAGNOSIS — G43901 Migraine, unspecified, not intractable, with status migrainosus: Secondary | ICD-10-CM

## 2012-12-01 MED ORDER — PREDNISONE 10 MG PO TABS *I*
ORAL_TABLET | ORAL | Status: DC
Start: 2012-12-01 — End: 2012-12-01

## 2012-12-01 MED ORDER — FLUTICASONE PROPIONATE 50 MCG/ACT NA SUSP *I*
2.0000 | Freq: Every day | NASAL | Status: DC
Start: 2012-12-01 — End: 2012-12-01

## 2012-12-01 MED ORDER — FLUTICASONE PROPIONATE 50 MCG/ACT NA SUSP *I*
2.0000 | Freq: Every day | NASAL | Status: DC
Start: 2012-12-01 — End: 2014-06-14

## 2012-12-01 MED ORDER — PREDNISONE 10 MG PO TABS *I*
ORAL_TABLET | ORAL | Status: DC
Start: 2012-12-01 — End: 2012-12-07

## 2012-12-01 NOTE — Patient Instructions (Signed)
Migraine Headache Discharge Instructions    About this topic   A migraine is a very specific type of headache. All headaches are not migraines. A migraine is caused by abnormal brain activity. It may be due to widening or narrowing of the blood vessels in the head. It may last for a couple of hours or a few days.  There are two types of migraine:  · Classical migraine ? It often starts with some problems in your eyesight. You may see spots, dots, or even zigzag lines. These are called "auras". An aura is the signal that a migraine is coming. It is followed by a very bad headache on one side of the head. Noise, light, and other activities can make it worse. Sometimes, it comes with upset stomach and throwing up.  · Common migraines ? These happen without an "aura".  Migraines can be treated with drugs that treat pain. Other drugs stop the brain activity that causes the migraine. If migraines happen often, drugs that prevent migraines can help. Migraines can be helped by sleep. Stress control like exercise, relaxation, and a regular routine of good nutrition and sleep are all helpful in preventing migraines. Lifestyle and dietary changes may also help you deal with a migraine.         What care is needed at home?   · Ask your doctor what you need to do when you go home. Make sure you ask questions if you do not understand what the doctor says. This way you will know what you need to do.  · Your doctor may give you drugs to control the pain. Take them as ordered by your doctor.  · Your doctor may teach you some ways to help control your migraine like relaxation and exercises.  · Keep a diary about your headaches. Write down when your headache happens. Write down what you were doing before it happened. This will help you learn what might be causing your headaches. Then, you can learn how to avoid them.  · Place an ice pack or a bag of frozen peas wrapped in a towel over your head. Never put ice right on the skin.  Do not leave the ice on more than 10 to 15 minutes at a time.  · Sleep when you get a migraine. Rest in a quiet, dark room.  · Do not make any big decisions until your migraine goes away.  · If certain drugs your doctor ordered trigger your migraine, your doctor may tell you to stop taking those drugs.  What follow-up care is needed?   · Your doctor may ask you to make visits to the office to check on your progress. Be sure to keep these visits.  · Your doctor may send you to a nerve specialist. This is a neurologist.  What drugs may be needed?   The doctor may order drugs to:  · Help with pain  · Relieve the migraine  · Prevent a migraine attack  · Treat an upset stomach and throwing up  · Treat a hormonal problem  Will physical activity be limited?   · Exercise. Regular exercise can help prevent migraines. If exercise triggers your migraine, talk to your doctor.  · Do not drive or run machinery until your migraine goes away.  What changes to diet are needed?   · Do not skip or delay meals. Drink lots of fluids. This may help prevent migraines.  · Avoid foods that may trigger the attack.   These may include processed, fermented, pickled, or marinated foods. Some of these are baked goods, chocolate, dairy products, nuts, onions, and peanut butter. Others are fruits like avocado, banana, or citrus fruit and meats like bacon, hot dogs, and cured meats.  · Limit caffeine intake. You will find that caffeine may help relieve pain. But, too much caffeine may also trigger an attack.  · Avoid drinking beer, wine, and mixed drinks (alcohol) if you think this is causing your migraines.  · Quit smoking. Smoking can worsen your migraine.  What problems could happen?   · Loss of work time if migraines come often  · Poor quality of life  What can be done to prevent this health problem?   · Some drugs may help prevent migraines. Talk to your doctor about the drugs you may need to take.  · Keep a sleeping routine. Go to sleep and get  up the same time every day.  · Take note of the things that may trigger your migraine. Knowing them is very important to help avoid an attack.  · Try different relaxation exercises to help.  When do I need to call the doctor?   · Signs of a very bad reaction. These include very bad headache beyond the usual; speech, vision, motion problems or loss of balance; headaches are worse when lying down or headaches suddenly starts. Go to the ER right away.  · Changes in migraine attacks  · Migraines more often than 3 times a month  · Health problem is not better or you are feeling worse  Where can I learn more?   FamilyDoctor.org  http://familydoctor.org/familydoctor/en/diseases-conditions/migraines.html   NHS Choices  http://www.nhs.uk/Conditions/Migraine/Pages/Causes.aspx   The National Migraine Association  http://www.migraines.org/disability/index.htm   Last Reviewed Date   2010-12-24  Disclaimer   This information is not specific medical advice and does not replace information you receive from your health care provider. This is only a brief summary of general information. It does NOT include all information about conditions, illnesses, injuries, tests, procedures, treatments, therapies, discharge instructions or life-style choices that may apply to you. You must talk with your health care provider for complete information about your health and treatment options. This information should not be used to decide whether or not to accept your health care provider’s advice, instructions or recommendations. Only your health care provider has the knowledge and training to provide advice that is right for you.  Copyright   All content copyright © 1978-2013 [employer] or its respective owners. All Rights Reserved.

## 2012-12-01 NOTE — Progress Notes (Signed)
Subjective:     Patient ID: Jasmine Nunez is a 42 y.o. female.    HPI  Jasmine Nunez is a 42 year old woman who comes in today with congestion  Had terrible allergy attack 2 nights ago.  Tried claritin D last night.  Tried sinus irrigation.  She feels like she has a sinus infection.  No ST.  Usually gets a sinus infection this time of year. Migraines are flaring.  Took amerge this AM with relief.  Migraines bad this month.  Started flonase last night.  Neurologist was going to call in prednisone last Friday, but it was not the pharmacy  Sacred has Essential Hypercholesterolemia; Allergic Rhinitis; Vitamin D Deficiency; Migraine without aura and without status migrainosus, not intractable; Pollen-food allergy syndrome; Fibromyalgia; GERD (gastroesophageal reflux disease); and Insomnia on her problem list.  Current Outpatient Prescriptions on File Prior to Visit   Medication Sig Dispense Refill   . naratriptan (AMERGE) 2.5 MG tablet TAKE ONE TABLET BY MOUTH AT THE ONSET OF HEADACHE. MAY REPEAT ONE TIME AFTER 4 HOURS AS NEEDED. DO NOT EXCEEDED 5MG  IN 24 HOURS.  9 tablet  5   . acetaminophen (TYLENOL) 500 mg tablet Take 1,000 mg by mouth 3 times daily as needed       . loratadine (CLARITIN) 10 MG tablet Take 10 mg by mouth daily       . promethazine (PHENERGAN) 12.5 MG tablet Take 1 tablet (12.5 mg total) by mouth Q4-6H PRN for Nausea (headache)  30 tablet  0   . Omega-3 Fatty Acids (FISH OIL) 1000 MG CAPS Take 1 capsule by mouth daily       . Multiple Vitamin tablet Take 1 tablet by mouth daily          . epinephrine (EPIPEN) 0.3 MG/0.3ML DEVI Inject 0.3 mg into the muscle once as needed           . cholecalciferol (VITAMIN D) 1000 UNIT tablet Take 1,000 Units by mouth daily             No current facility-administered medications on file prior to visit.     Miata is allergic to amitriptyline; environmental allergies; fluoxetine; food allergy formula; hydrocodone; and gabapentin.    Review of Systems          Objective:  BP  100/64  Pulse 95  Temp(Src) 36.9 C (98.5 F) (Temporal)  Ht 1.575 m (5\' 2" )  Wt 60.782 kg (134 lb)  BMI 24.5 kg/m2  SpO2 96%    Physical Exam   Constitutional: She appears well-developed and well-nourished. She appears ill. No distress.   HENT:   Right Ear: A middle ear effusion is present.   Left Ear: Tympanic membrane normal.   Nose: Mucosal edema and rhinorrhea present. Right sinus exhibits no maxillary sinus tenderness and no frontal sinus tenderness. Left sinus exhibits no maxillary sinus tenderness and no frontal sinus tenderness.   Mouth/Throat: Posterior oropharyngeal erythema (cobblestoning) present. No oropharyngeal exudate or posterior oropharyngeal edema.   Lymphadenopathy:     She has no cervical adenopathy.             Assessment:      1. Allergic rhinitis    2. Status migrainosus               Plan:      Jasmine Nunez was seen today for nasal congestion and headache.    Diagnoses and associated orders for this visit:    Allergic rhinitis  Explained to patient  that symptoms represent allergic rhinitis.  It is too soon to diagnose sinusitis.  She should call back if she continues to have symptoms next week.  Prescribed Flonase for patient to use to prevent symptoms    - fluticasone (FLONASE) 50 MCG/ACT nasal spray; 2 sprays by Nasal route daily    Status migrainosus  Prescribed a course of prednisone which should help reduce allergic rhinitis symptoms and help with migraines  - Discontinue: predniSONE (DELTASONE) 10 MG tablet; Once daily dosing: 6 tabs day 1, 5 tabs day 2, 4 tabs day 3, 3 tabs day 4, 2 tabs day 5, 1 tab day 6, then stop.  - predniSONE (DELTASONE) 10 MG tablet; Once daily dosing: 6 tabs day 1, 5 tabs day 2, 4 tabs day 3, 3 tabs day 4, 2 tabs day 5, 1 tab day 6, then stop.

## 2012-12-06 ENCOUNTER — Ambulatory Visit: Payer: Self-pay | Admitting: Neurology

## 2012-12-07 ENCOUNTER — Ambulatory Visit: Payer: Self-pay | Admitting: Neurology

## 2012-12-07 ENCOUNTER — Encounter: Payer: Self-pay | Admitting: Neurology

## 2012-12-07 VITALS — BP 126/70 | HR 90 | Ht 61.5 in | Wt 135.0 lb

## 2012-12-07 DIAGNOSIS — G43009 Migraine without aura, not intractable, without status migrainosus: Secondary | ICD-10-CM

## 2012-12-07 MED ORDER — PROPRANOLOL HCL 10 MG PO TABS *I*
10.0000 mg | ORAL_TABLET | Freq: Two times a day (BID) | ORAL | Status: DC
Start: 2012-12-07 — End: 2012-12-08

## 2012-12-07 NOTE — Patient Instructions (Signed)
   Try taking propranolol 10 mg twice daily.  Touch base with Dr. Latanya Presser via MyChart in 1-2 weeks to let him know how you are doing.  Your dose can be increased as needed and tolerated.  Get in touch with Dr. Latanya Presser sooner if you don't tolerate the medication.

## 2012-12-07 NOTE — Progress Notes (Signed)
I saw Jasmine Nunez in the [facility] on 12/07/2012 for follow-up evaluation of migraine headaches without aura.  I last saw her  on 03/15/2012.    HPI: She tells me that she feels great today.  Two days ago she thinks that she got one of the worst migraines she had ever gotten. She had felt well all day, and it hit her on the way home while driving.  She took naratriptan and promethazine and went to bed. When she woke up the following morning, the left side of her head still felt sore, but she was able to function fine.  August into September has been a very difficult time for her.  She did take a six day course of prednisone starting on September 3.  She does think it helped a bit, but she still gets migraines.  She has lost count overall of her migraine frequency. She has had multiple migraines that lasted multiple days.  She guesses that she has had 15.  She is not sure what triggered the increase.  She stopped gabapentin at least two months ago.  She doesn't know if her allergies are acting up to cause more migraines.  There is always an associated with her menstrual cycle.  She had tried naproxen with her menses and thought it helped, but she is not sure now.  She feels less clear headed than she did when she was on the gabapentin.  Her fibromyalgia pains tend to be increased with her migraines.  She feels good today.  She has been exercising and recently ran a 5K.  Sometimes she has bad body pains without a migraine.  Sometimes she gets increased pain in her upper back that spreads into her neck and the back of her head, and this triggers a migraine.  The last few days her sleep has been pretty good, but on average she feels rested about half the time in the morning.  Once she falls asleep she is usually good, but it has always taken her 30 minutes or longer to fall asleep.  She wakes up feeling like she was hit by a truck and has pain all over her body.  She doesn't feel  depressed.  She has no history of asthma.      The active problem list, past medical/surgical history, medications, allergies, social history, and family history were reviewed with the patient and/or caregiver.  I documented/updated these history elements and marked them as reviewed in the appropriate sections of the electronic health record.    REVIEW OF SYSTEMS:  She completed a 15 point review of systems questionnaire that I reviewed today and that will be scanned into the electronic health record.     VITALS:  Filed Vitals:    12/07/12 1041   BP: 126/70   Pulse: 90   Height: 1.562 m (5' 1.5")   Weight: 61.236 kg (135 lb)   Body mass index is 25.1 kg/(m^2).     EXAMINATION:  No formal examination was performed today.     IMPRESSION:   This is a 42 year old woman with migraine headaches without aura.  I spent 30 minutes with Jasmine Nunez today, more than 50% of which was spent in counseling and coordination of care.    PLAN:   She will start propranolol 10 mg BID.  She will get in touch with me in 1-2 weeks to let me know how she is doing, and we can increase her dose as needed and  tolerated.  She will let me know sooner if she is having adverse effects.   She will continue to use naratriptan and promethazine for acute migraine therapy.   I will see her back in 6 months.  I would be happy to see her sooner should the need arise.      Azucena Kuba, MD  Asst. Professor of Neurology  [facility]  Office: [phone]  12/07/2012 10:38 AM

## 2012-12-08 ENCOUNTER — Encounter: Payer: Self-pay | Admitting: Primary Care

## 2012-12-08 ENCOUNTER — Ambulatory Visit: Payer: Self-pay | Admitting: Primary Care

## 2012-12-08 VITALS — BP 116/80 | HR 82 | Temp 98.3°F | Ht 61.5 in | Wt 135.0 lb

## 2012-12-08 DIAGNOSIS — J329 Chronic sinusitis, unspecified: Secondary | ICD-10-CM

## 2012-12-08 DIAGNOSIS — G43009 Migraine without aura, not intractable, without status migrainosus: Secondary | ICD-10-CM

## 2012-12-08 MED ORDER — FLUCONAZOLE 150 MG PO TABS *I*
150.0000 mg | ORAL_TABLET | Freq: Once | ORAL | Status: AC
Start: 2012-12-08 — End: 2012-12-08

## 2012-12-08 MED ORDER — AMOXICILLIN 875 MG PO TABS *I*
875.0000 mg | ORAL_TABLET | Freq: Two times a day (BID) | ORAL | Status: DC
Start: 2012-12-08 — End: 2013-06-06

## 2012-12-08 NOTE — Progress Notes (Signed)
Subjective:     Patient ID: Jasmine Nunez is a 42 y.o. female.    HPI  Jasmine Nunez is a 42 year old woman comes in today for the complaint of nasal congestion.I started her on a course of prednisone 3 days ago for treatment of allergic rhinitis and migraine.  Finished prednisone.  Still feels congested.  Headache is a little better, but is still present    Jasmine Nunez has Essential Hypercholesterolemia; Allergic Rhinitis; Vitamin D Deficiency; Migraine without aura and without status migrainosus, not intractable; Pollen-food allergy syndrome; Fibromyalgia; GERD (gastroesophageal reflux disease); and Insomnia on her problem list.  Current Outpatient Prescriptions on File Prior to Visit   Medication Sig Dispense Refill   . naproxen sodium (ANAPROX) 220 MG tablet Take 220 mg by mouth 2 times daily (with meals)   Around the time of menses.       . fluticasone (FLONASE) 50 MCG/ACT nasal spray 2 sprays by Nasal route daily as needed       . naratriptan (AMERGE) 2.5 MG tablet TAKE ONE TABLET BY MOUTH AT THE ONSET OF HEADACHE. MAY REPEAT ONE TIME AFTER 4 HOURS AS NEEDED. DO NOT EXCEEDED 5MG  IN 24 HOURS.  9 tablet  5   . acetaminophen (TYLENOL) 500 mg tablet Take 1,000 mg by mouth 3 times daily as needed       . loratadine (CLARITIN) 10 MG tablet Take 10 mg by mouth daily       . promethazine (PHENERGAN) 12.5 MG tablet Take 1 tablet (12.5 mg total) by mouth Q4-6H PRN for Nausea (headache)  30 tablet  0   . Omega-3 Fatty Acids (FISH OIL) 1000 MG CAPS Take 1 capsule by mouth daily       . Multiple Vitamin tablet Take 1 tablet by mouth daily          . epinephrine (EPIPEN) 0.3 MG/0.3ML DEVI Inject 0.3 mg into the muscle once as needed           . cholecalciferol (VITAMIN D) 1000 UNIT tablet Take 1,000 Units by mouth daily             No current facility-administered medications on file prior to visit.     Jasmine Nunez is allergic to amitriptyline; fluoxetine; food allergy formula; hydrocodone; gabapentin; and topiramate.    Review of Systems           Objective:  BP 116/80  Pulse 82  Temp(Src) 36.8 C (98.3 F) (Temporal)  Ht 1.562 m (5' 1.5")  Wt 61.236 kg (135 lb)  BMI 25.1 kg/m2  SpO2 97%    Physical Exam   Constitutional: She appears well-developed and well-nourished. She appears ill.   HENT:   Right Ear: Tympanic membrane normal.   Left Ear: Tympanic membrane normal.   Nose: Right sinus exhibits maxillary sinus tenderness and frontal sinus tenderness. Left sinus exhibits maxillary sinus tenderness and frontal sinus tenderness.   Pulmonary/Chest: Breath sounds normal.             Assessment:      1. Migraine without aura and without status migrainosus, not intractable    2. Sinusitis               Plan:      Jasmine Nunez was seen today for nasal congestion and uri.    Diagnoses and associated orders for this visit:    Migraine without aura and without status migrainosus, not intractable  Patient wishes to pursue complementary medicine and treatment of her migraine which  I encouraged her to do    Sinusitis  It appears that she has developed a sinusitis secondary to the allergic rhinitis.  Prescribed amoxicillin 875 mg twice a day for 10 days.  Provided a prescription for Diflucan should she develop a yeast infection  - amoxicillin (AMOXIL) 875 MG tablet; Take 1 tablet (875 mg total) by mouth 2 times daily  - fluconazole (DIFLUCAN) 150 MG tablet; Take 1 tablet (150 mg total) by mouth once for 1 dose

## 2012-12-16 ENCOUNTER — Other Ambulatory Visit: Payer: Self-pay | Admitting: Primary Care

## 2012-12-16 ENCOUNTER — Other Ambulatory Visit: Payer: Self-pay | Admitting: Neurology

## 2012-12-16 MED ORDER — FLUCONAZOLE 150 MG PO TABS *I*
150.0000 mg | ORAL_TABLET | Freq: Once | ORAL | Status: DC
Start: 2012-12-16 — End: 2016-09-29

## 2012-12-16 MED ORDER — NARATRIPTAN HCL 2.5 MG PO TABS *A*
ORAL_TABLET | ORAL | Status: DC
Start: 2012-12-16 — End: 2013-06-01

## 2012-12-16 NOTE — Telephone Encounter (Signed)
Pt was told that there would be a script for Diflucan sent to the pharmacy for a yeast infection along with antibiotic for sinus infection. The antibiotic was called in, but the diflucan wasn't. Pt continues to have severe vaginal itching and was wondering if she can have a script called into the [facility] pharmacy. Thank you.

## 2012-12-16 NOTE — Telephone Encounter (Signed)
Dr.Cicilline can you resend the Rx for the Diflucan to the pharmacy? [facility] lpn

## 2012-12-16 NOTE — Telephone Encounter (Signed)
done

## 2012-12-16 NOTE — Telephone Encounter (Signed)
Left message on machine that Rx for Diflucan was printed out.  Inquired if pt had printed Rx for Diflucan. [facility] lpn

## 2012-12-21 ENCOUNTER — Encounter: Payer: Self-pay | Admitting: Primary Care

## 2012-12-21 ENCOUNTER — Telehealth: Payer: Self-pay | Admitting: Neurology

## 2012-12-21 NOTE — Telephone Encounter (Signed)
propanoral is not working, patient has had  6 days of migraines since she started. Tami feels real bad today. Please call

## 2012-12-21 NOTE — Telephone Encounter (Signed)
I spoke with Jasmine Nunez.  She has had 6 migraines since we saw each other two weeks ago.  She was feeling bad this morning, but is much better now after taking naratriptan and acetaminophen.  She is going through her naratriptan supply.  She is taking propranolol 10 mg one tablet twice daily without adverse effect.  I recommended that she increase to two tablets BID and get in touch with me next week to let me know how things are going.  We can increase her dose as needed and tolerated.  If she doesn't tolerate the increase, she will let me know.

## 2012-12-23 ENCOUNTER — Ambulatory Visit: Payer: Self-pay | Admitting: Neurology

## 2012-12-27 ENCOUNTER — Encounter: Payer: Self-pay | Admitting: Neurology

## 2012-12-27 ENCOUNTER — Encounter: Payer: Self-pay | Admitting: Primary Care

## 2012-12-31 ENCOUNTER — Ambulatory Visit: Payer: Self-pay

## 2013-01-03 ENCOUNTER — Ambulatory Visit
Admit: 2013-01-03 | Discharge: 2013-01-03 | Disposition: A | Discharge status: Home / Self Care | Payer: Self-pay | Source: Ambulatory Visit | Attending: Family Medicine | Admitting: Family Medicine

## 2013-01-03 LAB — T3: T3: 103 ng/dL (ref 80–200)

## 2013-01-03 LAB — T4, FREE: Free T4: 1.2 ng/dL (ref 0.9–1.7)

## 2013-01-03 LAB — CBC
Hematocrit: 39 % (ref 34–45)
Hemoglobin: 13 g/dL (ref 11.2–15.7)
MCV: 85 fL (ref 79–95)
Platelets: 251 10*3/uL (ref 160–370)
RBC: 4.6 MIL/uL (ref 3.9–5.2)
RDW: 13.5 % (ref 11.7–14.4)
WBC: 7.2 10*3/uL (ref 4.0–10.0)

## 2013-01-03 LAB — VITAMIN B12: Vitamin B12: 586 pg/mL (ref 211–946)

## 2013-01-03 LAB — TSH: TSH: 1.45 u[IU]/mL (ref 0.27–4.20)

## 2013-01-03 LAB — FERRITIN: Ferritin: 29 ng/mL (ref 10–120)

## 2013-01-03 LAB — ESTRADIOL: Estradiol: 60 pg/mL

## 2013-01-05 LAB — T3, FREE: T3,Free: 3.1 pg/mL (ref 2.0–4.4)

## 2013-01-05 LAB — RESOLUTION

## 2013-01-05 LAB — DHEA-SULFATE: DHEA Sulfate: 51 ug/dL — ABNORMAL LOW (ref 61–337)

## 2013-01-05 LAB — PROGESTERONE: Progesterone: 1.9 ng/mL

## 2013-01-06 LAB — PREGNENOLONE: Pregnenolone: 57 ng/dL (ref 15–132)

## 2013-01-06 LAB — T3, REVERSE: T3, Reverse: 15.6 ng/dL (ref 9.0–27.0)

## 2013-01-24 ENCOUNTER — Encounter: Payer: Self-pay | Admitting: Neurology

## 2013-01-24 DIAGNOSIS — G43009 Migraine without aura, not intractable, without status migrainosus: Secondary | ICD-10-CM

## 2013-01-24 MED ORDER — PROMETHAZINE HCL 12.5 MG PO TABS *I*
ORAL_TABLET | ORAL | Status: DC
Start: 2013-01-24 — End: 2014-12-18

## 2013-02-16 ENCOUNTER — Other Ambulatory Visit: Payer: Self-pay

## 2013-03-15 ENCOUNTER — Encounter: Payer: Self-pay | Admitting: Primary Care

## 2013-03-15 ENCOUNTER — Ambulatory Visit
Admit: 2013-03-15 | Discharge: 2013-03-15 | Disposition: A | Discharge status: Home / Self Care | Payer: Self-pay | Source: Ambulatory Visit | Attending: Obstetrics | Admitting: Obstetrics

## 2013-03-15 LAB — HM MAMMOGRAPHY

## 2013-03-21 ENCOUNTER — Telehealth: Payer: Self-pay | Admitting: Primary Care

## 2013-03-21 MED ORDER — POLYMYXIN B-TRIMETHOPRIM 10000-0.1 UNIT/ML-% OP SOLN *I*
1.0000 [drp] | Freq: Four times a day (QID) | OPHTHALMIC | Status: AC
Start: 2013-03-21 — End: 2013-03-28

## 2013-03-21 NOTE — Telephone Encounter (Signed)
Sent in  She should remove and discard contacts if she wears them  If not improving in 2d or for worsening would need opthalmology

## 2013-03-21 NOTE — Telephone Encounter (Signed)
Patients daughter was diagnosed with pink eye last week.  She now has it, too, in both eyes.  She has used her daughters drops this morning and wants to know if she should continue to do that or if she should have a script of her own.  Please call.

## 2013-03-21 NOTE — Telephone Encounter (Signed)
We saw her daughter last week and diagnosed with conjunctivitis.  Forward to covering MD: Dr. Ladona Ridgel to advise.

## 2013-03-21 NOTE — Telephone Encounter (Signed)
Spoke with patient:  -She will start the eye drops as directed.

## 2013-04-05 ENCOUNTER — Ambulatory Visit
Admit: 2013-04-05 | Discharge: 2013-04-05 | Disposition: A | Discharge status: Home / Self Care | Payer: Self-pay | Source: Ambulatory Visit | Attending: Family | Admitting: Family

## 2013-04-06 LAB — VAGINITIS SCREEN: DNA PROBE: Vaginitis Screen:DNA Probe: POSITIVE

## 2013-04-27 ENCOUNTER — Other Ambulatory Visit: Payer: Self-pay | Admitting: Neurology

## 2013-04-27 ENCOUNTER — Telehealth: Payer: Self-pay | Admitting: Neurology

## 2013-04-27 DIAGNOSIS — G43909 Migraine, unspecified, not intractable, without status migrainosus: Secondary | ICD-10-CM

## 2013-04-27 MED ORDER — NAPROXEN 375 MG PO TBEC *A*
DELAYED_RELEASE_TABLET | ORAL | Status: DC
Start: 2013-04-27 — End: 2014-10-03

## 2013-04-27 NOTE — Telephone Encounter (Signed)
Jasmine Nunez called stating that she has recently started to have an increase in her migraines. It started around 1/16. Her medication regiment worked great prior to that but recently she has found that her rescue meds aren't working for as long. The migraine comes back the next day. She wonders if dose of Prednisone might help. Please call her back to discuss.

## 2013-04-27 NOTE — Telephone Encounter (Signed)
Headaches since 1/16, takes naratriptan and promethazine , every other day without total relief  Did wait 48 hours but headache returned and took naratriptan today and is helping some.Marland Kitchen.  Has not been exercising as prior but does drink enough fluid and gets adequate rest..  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    Had Menses when headache started but then headache persisted post menses..    Declines preventative headache agent..did not like propranolol SE     presently Is getting relief from naratriptan.  . If HA persists.Marland Kitchen.try naproxen with food.Can repeat in 4-6 hours...rx sent to [facility] [address]   She will call if headache persists ,,may consider medrol dose pack.

## 2013-06-01 ENCOUNTER — Other Ambulatory Visit: Payer: Self-pay | Admitting: Neurology

## 2013-06-06 ENCOUNTER — Encounter: Payer: Self-pay | Admitting: Neurology

## 2013-06-06 ENCOUNTER — Ambulatory Visit: Payer: Self-pay | Admitting: Neurology

## 2013-06-06 VITALS — BP 134/82 | HR 120 | Wt 132.0 lb

## 2013-06-06 DIAGNOSIS — G43009 Migraine without aura, not intractable, without status migrainosus: Secondary | ICD-10-CM

## 2013-06-06 NOTE — Progress Notes (Signed)
I saw Jasmine Nunez in the [facility] on 06/06/2013 for follow-up evaluation of her migraine headaches.  I last saw her  on 12/07/2012.  At that visit we started propranolol for migraine prevention, but she let me know later in the month that she stopped taking it because she felt it was making her migraines worse.  She communicated with the general neurology nurse practitioner in January about daily migraines.    HPI: She tells me that things have been better since she called in January.  She still gets migraines fairly often, but she considers them bad when she can't get rid of them.  In January she was wondering if she was getting rebound migraines with almost two weeks of daily migraines.  At this point, she probably gets nine migraines a month.  She is taking naproxen EC first, and if this doesn't work she uses naratriptan.  She has to use the naratriptan at least the time.  She hasn't been using promethazine since she started using naratriptan.  She isn't using acetaminophen either.  The naratriptan works great when it is paired with the naproxen.  She has not restarted the propranolol.  Her headache frequency improved when she stopped it.  She feels like she is sleeping better and has more energy when she wakes up.  She prefers to avoid taking a daily preventative medication.      The active problem list, past medical/surgical history, medications, allergies, social history, and family history were reviewed with the patient and/or caregiver.  I documented/updated these history elements and marked them as reviewed in the appropriate sections of the electronic health record.    REVIEW OF SYSTEMS:  She completed a 15 point review of systems questionnaire that I reviewed today and that will be scanned into the electronic health record.     VITALS:  Filed Vitals:    06/06/13 1040   BP: 134/82   Pulse: 120   Weight: 59.875 kg (132 lb)   Body mass index is 24.54 kg/(m^2).      EXAMINATION:  No formal examination was performed today.      IMPRESSION:   This is a 43 year old woman with migraine headaches without aura.  I spent 20 minutes with Mrs. Bais today, more than 50% of which was spent in counseling and coordination of care.  We discussed her use of abortive medications.  She has better results when she takes the naproxen immediately at onset of migraine symptoms, but she generally waits several hours to take something.  Some days she has prodromal symptoms of fatigue, increased sense of stress, or mental fogginess before developing typical migraine symptoms.  It is not clear if these are part of her migraine syndrome, or if they reflect poor sleep, stress, or anxiety that then make her more prone to migraines.  I encouraged her to use the naproxen immediately at the onset of her migraine symptoms, and to take it if she is experiencing typical prodromal symptoms.  She will use the naratriptan as rescue therapy.  While she continues to have 9 or more migraines a month, she prefers not to use a preventative medication.  We have tried several that were either not well tolerated or were not effective.     PLAN:   She will use naproxen immediately at onset of migraine prodrome or typical migraine symptoms.  She will use naratriptan as rescue therapy.   I will see her back in one year.  I would  be happy to see her sooner if needed.      Azucena Kuba, MD  Asst. Professor of Neurology  [facility]  Office: [phone]  06/06/2013 10:26 AM

## 2013-07-09 ENCOUNTER — Other Ambulatory Visit: Payer: Self-pay | Admitting: Neurology

## 2013-08-17 ENCOUNTER — Ambulatory Visit
Admit: 2013-08-17 | Discharge: 2013-08-17 | Disposition: A | Discharge status: Home / Self Care | Payer: Self-pay | Source: Ambulatory Visit | Attending: Family | Admitting: Family

## 2013-08-17 LAB — VAGINITIS SCREEN: DNA PROBE: Vaginitis Screen:DNA Probe: POSITIVE

## 2013-08-18 LAB — AEROBIC CULTURE: Aerobic Culture: 0

## 2013-08-29 ENCOUNTER — Ambulatory Visit: Payer: Self-pay | Admitting: Family Medicine

## 2013-08-29 ENCOUNTER — Encounter: Payer: Self-pay | Admitting: Family Medicine

## 2013-08-29 VITALS — BP 98/64 | HR 84 | Resp 20 | Ht 61.5 in | Wt 133.8 lb

## 2013-08-29 DIAGNOSIS — J020 Streptococcal pharyngitis: Secondary | ICD-10-CM

## 2013-08-29 LAB — POCT AMBULATORY RAPID STREP
Lot #: 151024
Rapid Strep Group A Throat-POC: POSITIVE

## 2013-08-29 MED ORDER — AMOXICILLIN 875 MG PO TABS *I*
875.0000 mg | ORAL_TABLET | Freq: Two times a day (BID) | ORAL | Status: AC
Start: 2013-08-29 — End: 2013-09-08

## 2013-08-29 NOTE — Progress Notes (Signed)
Subjective:     Patient ID: Jasmine Nunez is a 43 y.o. female.    HPI Comments: Patient presents with sudden onset sore throat, body aches, and chills two days ago  Reports subjective fever and headache  Denies nasal congestion, cough, or difficulty breathing  Dayquil didn't help  Took Naproxen last night, and feels better today      Allergies, medications, and problem list were reviewed with the patient and updated in Epic as appropriate.    Allergy History as of 08/29/13    NO KNOWN DRUG ALLERGY      Noted Status Severity Type Reaction    11/01/10 0850 Sharman CrateMacDonald, Nicole C 03/08/04 Deleted       10/16/10 1629 Benito MccreedySwain, Victoria M 03/08/04 Active       07/15/10 1632 Edi Conversion, Allergies 03/08/04 Active       Comments:  Created by Conversion - 0;            NO KNOWN LATEX ALLERGY      Noted Status Severity Type Reaction    11/01/10 0850 Rodena MedinMacDonald, Nicole C 03/08/04 Deleted       10/16/10 1629 Benito MccreedySwain, Victoria M 03/08/04 Active       07/15/10 1632 Edi Conversion, Allergies 03/08/04 Active       Comments:  Created by Conversion - 0;            ENVIRONMENTAL ALLERGIES      Noted Status Severity Type Reaction    12/07/12 1053 Azucena KubaMaroldo, Anthony M, MD 12/04/10 Deleted  Allergy     Comments:  spring, summer, fall pollens     12/04/10 1611 Nadara EatonMarcus, Carolina, MD 12/04/10 Active       Comments:  Cats, dogs, spring, summer, fall pollens           FOOD ALLERGY FORMULA      Noted Status Severity Type Reaction    08/29/13 0911 Paticia Stackconnor, Tracy A, LPN 60/45/4007/08/10 Active High Allergy Anaphylaxis    Comments:  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            AMITRIPTYLINE      Noted Status Severity Type Reaction    06/10/11 1352 Azucena KubaMaroldo, Anthony M, MD 06/10/11 Active  Intolerance Other (See Comments)    Comments:  Worsened depression           FLUOXETINE      Noted Status Severity Type Reaction    06/10/11 1352  Azucena KubaMaroldo, Anthony M, MD 06/10/11 Active  Intolerance Other (See Comments)    Comments:  Over stimulation           HYDROCODONE      Noted Status Severity Type Reaction    03/15/12 0923 Azucena KubaMaroldo, Anthony M, MD 03/15/12 Active  Intolerance Other (See Comments)    Comments:  Lightheadedness             GABAPENTIN      Noted Status Severity Type Reaction    11/26/12 1751 Azucena KubaMaroldo, Anthony M, MD 11/26/12 Active Low Intolerance Other (See Comments)    Comments:  Fatigue, bloating, cognitive changes           TOPIRAMATE      Noted Status Severity Type Reaction    12/07/12 1055 Azucena KubaMaroldo, Anthony M, MD 12/07/12 Active Low Intolerance Other (See Comments)    Comments:  Cognitive slowing           PROPRANOLOL      Noted Status Severity Type Reaction    06/06/13 1039 Azucena Kuba, MD 06/06/13 Active Low Intolerance Other (See Comments)    Comments:  Worsened migraines                 Current Outpatient Prescriptions   Medication Sig Note    naratriptan (AMERGE) 2.5 MG tablet TAKE 1 TABLET BY MOUTH AT ONSET OF MIGRAINE. MAY REPEAT ONE TIME AFTER 4 HOURS AS NEEDED**MAX 2/DAY     naproxen ec (NAPROSYN EC) 375 MG tablet Take 1 tab at headache onset, MR in 4-6 hours, MDD 1250mg      loratadine (CLARITIN) 10 MG tablet Take 10 mg by mouth daily 05/25/2012: PRN    Omega-3 Fatty Acids (FISH OIL) 1000 MG CAPS Take 1 capsule by mouth daily     Multiple Vitamin tablet Take 1 tablet by mouth daily        cholecalciferol (VITAMIN D) 1000 UNIT tablet Take 1,000 Units by mouth daily         promethazine (PHENERGAN) 12.5 MG tablet Take one or two tablets every 6 hours as needed for nausea.     fluticasone (FLONASE) 50 MCG/ACT nasal spray 2 sprays by Nasal route daily as needed     epinephrine (EPIPEN) 0.3 MG/0.3ML DEVI Inject 0.3 mg into the muscle once as needed          No current facility-administered medications for this visit.       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 No distress.   HENT:   Right Ear: Tympanic membrane, external ear and ear canal normal.   Left Ear: Tympanic membrane, external ear and ear canal normal.   Mouth/Throat: Uvula is midline. No uvula swelling. Oropharyngeal exudate and posterior oropharyngeal erythema present. No posterior oropharyngeal edema or tonsillar abscesses.   Handling secretions without difficulty   Cardiovascular: Normal rate and regular rhythm.    Pulmonary/Chest: Effort normal and breath sounds normal.   Vitals reviewed.            Assessment:      Patient with sudden onset pharyngitis and body aches and likely fever  Rapid strep is positive         Plan:        Strep pharyngitis    - POCT Ambulatory Rapid Strep    - amoxicillin (AMOXIL) 875 MG tablet; Take 1 tablet (875 mg total) by mouth 2 times daily for 10 days      Patient Instructions     Take the antibiotic as directed  Gargle with warm salt water  Continue Naproxen 1 tablet twice daily  Rest, fluids      Patient will call if symptoms worsen or fail to improve

## 2013-08-29 NOTE — Patient Instructions (Signed)
Take the antibiotic as directed  Gargle with warm salt water  Continue Naproxen 1 tablet twice daily  Rest, fluids

## 2013-12-26 ENCOUNTER — Ambulatory Visit: Payer: Self-pay

## 2014-03-13 ENCOUNTER — Encounter: Payer: Self-pay | Admitting: Primary Care

## 2014-03-13 NOTE — Telephone Encounter (Signed)
See patient's chart.

## 2014-03-15 ENCOUNTER — Other Ambulatory Visit: Payer: Self-pay | Admitting: Obstetrics

## 2014-03-15 DIAGNOSIS — Z1231 Encounter for screening mammogram for malignant neoplasm of breast: Secondary | ICD-10-CM

## 2014-03-20 ENCOUNTER — Other Ambulatory Visit: Payer: Self-pay | Admitting: Diagnostic Radiology

## 2014-03-20 ENCOUNTER — Ambulatory Visit
Admit: 2014-03-20 | Discharge: 2014-03-20 | Disposition: A | Discharge status: Home / Self Care | Payer: Self-pay | Source: Ambulatory Visit | Attending: Primary Care | Admitting: Primary Care

## 2014-03-20 DIAGNOSIS — R9409 Abnormal results of other function studies of central nervous system: Secondary | ICD-10-CM

## 2014-03-20 LAB — HM MAMMOGRAPHY

## 2014-03-22 ENCOUNTER — Encounter: Payer: Self-pay | Admitting: Primary Care

## 2014-03-29 ENCOUNTER — Encounter: Payer: Self-pay | Admitting: Primary Care

## 2014-04-05 ENCOUNTER — Encounter: Payer: Self-pay | Admitting: Radiology

## 2014-04-06 ENCOUNTER — Ambulatory Visit
Admit: 2014-04-06 | Discharge: 2014-04-06 | Disposition: A | Discharge status: Home / Self Care | Payer: Self-pay | Source: Ambulatory Visit | Attending: Primary Care | Admitting: Primary Care

## 2014-04-06 LAB — HM MAMMOGRAPHY

## 2014-04-14 ENCOUNTER — Encounter: Payer: Self-pay | Admitting: Primary Care

## 2014-05-11 ENCOUNTER — Other Ambulatory Visit: Payer: Self-pay | Admitting: Neurology

## 2014-05-11 DIAGNOSIS — G43009 Migraine without aura, not intractable, without status migrainosus: Secondary | ICD-10-CM

## 2014-05-25 ENCOUNTER — Encounter: Payer: Self-pay | Admitting: Primary Care

## 2014-05-25 NOTE — Telephone Encounter (Signed)
See patient's chart.

## 2014-06-05 ENCOUNTER — Ambulatory Visit: Payer: Self-pay | Admitting: Neurology

## 2014-06-14 ENCOUNTER — Other Ambulatory Visit: Payer: Self-pay | Admitting: Primary Care

## 2014-08-31 ENCOUNTER — Telehealth: Payer: Self-pay | Admitting: Neurology

## 2014-08-31 NOTE — Telephone Encounter (Signed)
err

## 2014-10-03 ENCOUNTER — Other Ambulatory Visit: Payer: Self-pay | Admitting: Neurology

## 2014-10-03 DIAGNOSIS — G43909 Migraine, unspecified, not intractable, without status migrainosus: Secondary | ICD-10-CM

## 2014-10-03 NOTE — Telephone Encounter (Signed)
Please contact Mrs. Jerger to reschedule her follow-up visit that she canceled in March.  I renewed her naproxen prescription today.  Thanks.

## 2014-10-04 NOTE — Telephone Encounter (Signed)
Called and spoke with Jasmine Nunez, scheduled 9/19

## 2014-10-24 ENCOUNTER — Other Ambulatory Visit: Payer: Self-pay | Admitting: Neurology

## 2014-10-24 DIAGNOSIS — G43009 Migraine without aura, not intractable, without status migrainosus: Secondary | ICD-10-CM

## 2014-12-18 ENCOUNTER — Encounter: Payer: Self-pay | Admitting: Neurology

## 2014-12-18 ENCOUNTER — Ambulatory Visit: Payer: Self-pay | Admitting: Neurology

## 2014-12-18 VITALS — BP 110/68 | HR 84 | Ht 61.75 in | Wt 141.5 lb

## 2014-12-18 DIAGNOSIS — G43009 Migraine without aura, not intractable, without status migrainosus: Secondary | ICD-10-CM

## 2014-12-18 NOTE — Progress Notes (Signed)
I saw Jasmine Nunez in the [facility] on 12/18/2014 for follow-up evaluation of migraine headaches.  I last saw her  on 06/06/2013.  She cancelled and did not reschedule her follow-up with me on 06/05/2014.    HPI: She tells me that things are better, or maybe about the same. She has been keeping track of her migraine frequency, and she is not having as many as she thought that she was.  She had 9 migraines in June, 10 in July, 5 in August, and 5 in September.  She thinks that hormone levels and stress are the cause of the variability, though August was not less stressful than previous months.  She was not exercising during the summer.  She has gained about 10 pounds.  When she has migraines, she tends to have a voracious appetite.  She has been taking naratriptan or naproxen for her migraines.  She starts with naproxen, and if this doesn't help she uses naratriptan.  Sometimes she does the reverse. She doesn't take a second dose of naratriptan out of concern for running out of tablets.  She usually takes her medication soon after her symptoms start.  Her sleep seems pretty good, though sometimes she has trouble sleeping. She thinks that this might be hormone related.  She has nights that she dreams all night.  As long as she exercises, her fibromyalgia is better.  She thinks that the fibromyalgia and the migraines go together.  Some days she feels super achy in her body and takes naratriptan because she often gets a migraine on those days.  She doesn't exercise in the summer because her kids are home.      The active problem list, past medical/surgical history, medications, allergies, social history, and family history were reviewed with the patient and/or caregiver.  I documented/updated these history elements and marked them as reviewed in the appropriate sections of the electronic health record.    REVIEW OF SYSTEMS:  She completed a 15 point review of systems questionnaire that I reviewed  today and that will be scanned into the electronic health record.     VITALS:  Vitals:    12/18/14 1042   BP: 110/68   Pulse: 84   Weight: 64.2 kg (141 lb 8 oz)   Height: 1.568 m (5' 1.75")   Body mass index is 26.09 kg/(m^2).     EXAMINATION:  No formal examination was performed today.     IMPRESSION/DISCUSSION:   This is a 44 year old woman with migraine without aura.  I spent 20 minutes with Jasmine Nunez today, more than 50% of which was spent in counseling and coordination of care.  We discussed management of her migraines.  We are not going to make any changes today.    PLAN:   She will continue naratriptan 2.5 mg one tablet as needed for acute migraine management.   I will see her back in one year.  I would be happy to see her sooner if needed.      Azucena Kuba, MD  Assoc. Professor of Clinical Neurology  Pager: [phone]  Office: [phone]  12/18/2014 10:39 AM

## 2014-12-20 ENCOUNTER — Ambulatory Visit: Payer: Self-pay

## 2014-12-21 ENCOUNTER — Ambulatory Visit: Payer: Self-pay

## 2015-01-11 ENCOUNTER — Other Ambulatory Visit: Payer: Self-pay | Admitting: Neurology

## 2015-01-11 DIAGNOSIS — G43009 Migraine without aura, not intractable, without status migrainosus: Secondary | ICD-10-CM

## 2015-05-09 ENCOUNTER — Encounter: Payer: Self-pay | Admitting: Radiology

## 2015-05-14 ENCOUNTER — Telehealth: Payer: Self-pay | Admitting: Primary Care

## 2015-05-14 DIAGNOSIS — R922 Inconclusive mammogram: Secondary | ICD-10-CM

## 2015-05-14 DIAGNOSIS — Z1239 Encounter for other screening for malignant neoplasm of breast: Secondary | ICD-10-CM

## 2015-05-14 NOTE — Telephone Encounter (Signed)
Patient calling stating that she needs an order put in for her mammogram stating that last year she had a regular and then had to do a bilateral test because she has dense breasts would like these both ordered so she can get them done on the same day. Please call back when ordered

## 2015-05-16 DIAGNOSIS — R922 Inconclusive mammogram: Secondary | ICD-10-CM | POA: Insufficient documentation

## 2015-05-16 NOTE — Telephone Encounter (Signed)
done

## 2015-05-17 ENCOUNTER — Other Ambulatory Visit: Payer: Self-pay

## 2015-05-17 NOTE — Telephone Encounter (Signed)
PT NOTIFIED  

## 2015-05-23 ENCOUNTER — Telehealth: Payer: Self-pay | Admitting: Primary Care

## 2015-05-23 MED ORDER — OSELTAMIVIR PHOSPHATE 75 MG PO CAPS *I*
75.0000 mg | ORAL_CAPSULE | Freq: Two times a day (BID) | ORAL | 0 refills | Status: DC
Start: 2015-05-23 — End: 2015-07-25

## 2015-05-23 NOTE — Telephone Encounter (Signed)
treated

## 2015-05-23 NOTE — Telephone Encounter (Signed)
Note      I spoke with the patient's mother to advise. She is also now sick. She is afebrile. Denies nausea and vomiting.She has the chills, cough body aches . Signs and symptoms started yesterday.  Should she be taking tamiflu as well?

## 2015-05-23 NOTE — Telephone Encounter (Signed)
I spoke with the patient to advise

## 2015-05-28 ENCOUNTER — Ambulatory Visit
Admission: RE | Admit: 2015-05-28 | Discharge: 2015-05-28 | Disposition: A | Discharge status: Home / Self Care | Payer: Self-pay | Source: Ambulatory Visit | Attending: Obstetrics | Admitting: Obstetrics

## 2015-07-25 ENCOUNTER — Other Ambulatory Visit: Payer: Self-pay | Admitting: Neurology

## 2015-07-25 ENCOUNTER — Encounter: Payer: Self-pay | Admitting: Family Medicine

## 2015-07-25 ENCOUNTER — Ambulatory Visit: Payer: Self-pay | Admitting: Family Medicine

## 2015-07-25 VITALS — BP 100/62 | HR 88 | Resp 20 | Ht 61.75 in | Wt 141.0 lb

## 2015-07-25 DIAGNOSIS — G43909 Migraine, unspecified, not intractable, without status migrainosus: Secondary | ICD-10-CM

## 2015-07-25 MED ORDER — METHYLPREDNISOLONE 4 MG PO TBPK *A*
ORAL_TABLET | ORAL | 0 refills | Status: DC
Start: 2015-07-25 — End: 2015-07-31

## 2015-07-25 NOTE — Progress Notes (Signed)
Subjective:     Patient ID: Jasmine Nunez is a 45 y.o. female.    HPI Comments: Patient presents with primary complaint of a migraine.  She has a history of migraines for which she is followed by neurology.  Current exacerbation started approximately ten days ago.  She complains of a biparietal headache, sensitivity to light and sound, and blurred vision in the left eye.  Symptoms make it difficult for her to focus.  No nausea, numbness, or weakness.  She has been taking Naproxen and Naratriptan.  Felt better 3-4 days ago but symptoms returned full force.  Tried taking a drug holiday yesterday thinking she may be experiencing a rebound headache, but it didn't make a difference.  She wonders if current exacerbation is related to a recent medication change.  Previously took Prempro for PMS symptoms which was effective and well tolerated.  She switched to an alternative due to cost, and it made her menstrual cycle "wonky".  Her GYN then restarted Estradiol and Medroxyprogesterone separately 1-2 weeks ago.      Allergies, medications, and problem list were reviewed with the patient and updated in Epic as appropriate.    Allergy History as of 07/25/15     NO KNOWN DRUG ALLERGY       Noted Status Severity Type Reaction    11/01/10 0850 Sharman Crate C 03/08/04 Deleted       10/16/10 1629 Benito Mccreedy M 03/08/04 Active       07/15/10 1632 Edi Conversion, Allergies 03/08/04 Active       Comments:  Created by Conversion - 0;            NO KNOWN LATEX ALLERGY       Noted Status Severity Type Reaction    11/01/10 0850 Rodena Medin 03/08/04 Deleted       10/16/10 1629 Benito Mccreedy M 03/08/04 Active       07/15/10 1632 Edi Conversion, Allergies 03/08/04 Active       Comments:  Created by Conversion - 0;            ENVIRONMENTAL ALLERGIES       Noted Status Severity Type Reaction    12/07/12 1053 Azucena Kuba, MD 12/04/10 Deleted  Allergy     Comments:  spring, summer, fall pollens     12/04/10 1611 Nadara Eaton, MD 12/04/10 Active       Comments:  Cats, dogs, spring, summer, fall pollens           FOOD ALLERGY FORMULA       Noted Status Severity Type Reaction    08/29/13 0911 Paticia Stack, LPN 16/10/96 Active High Allergy Anaphylaxis    Comments:  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            AMITRIPTYLINE       Noted Status Severity Type Reaction    06/10/11 1352 Azucena Kuba, MD 06/10/11 Active  Intolerance Other (See Comments)    Comments:  Worsened depression           FLUOXETINE       Noted Status Severity Type Reaction    06/10/11 1352 Azucena Kuba, MD 06/10/11 Active  Intolerance Other (See Comments)    Comments:  Over stimulation  HYDROCODONE       Noted Status Severity Type Reaction    03/15/12 0923 Azucena KubaMaroldo, Anthony M, MD 03/15/12 Active  Intolerance Other (See Comments)    Comments:  Lightheadedness             GABAPENTIN       Noted Status Severity Type Reaction    11/26/12 1751 Azucena KubaMaroldo, Anthony M, MD 11/26/12 Active Low Intolerance Other (See Comments)    Comments:  Fatigue, bloating, cognitive changes           TOPIRAMATE       Noted Status Severity Type Reaction    12/07/12 1055 Azucena KubaMaroldo, Anthony M, MD 12/07/12 Active Low Intolerance Other (See Comments)    Comments:  Cognitive slowing           PROPRANOLOL       Noted Status Severity Type Reaction    06/06/13 1039 Azucena KubaMaroldo, Anthony M, MD 06/06/13 Active Low Intolerance Other (See Comments)    Comments:  Worsened migraines                 Current Outpatient Prescriptions   Medication Sig Note    estradiol (ESTRACE) 0.5 MG tablet Take 0.5 mg by mouth daily    FOR 30 DAYS 07/25/2015: Received from: External Pharmacy Received Sig: TAKE 1 TABLET BY MOUTH EVERY DAY FOR 30 DAYS    medroxyPROGESTERone (PROVERA) 2.5 MG tablet Take 2.5 mg by mouth daily    FOR 30 DAYS 07/25/2015: Received from: External  Pharmacy Received Sig: TAKE 1 TABLET BY MOUTH EVERY DAY FOR 30 DAYS    naratriptan (AMERGE) 2.5 MG tablet TAKE 1 TABLET BY MOUTH AT ONSET OF MIGRAINE. MAY REPEAT ONCE AFTER 4 HOURS AS NEEDED *MAX= 2/DAY*     naproxen (NAPROSYN) 375 MG tablet TAKE 1 TABLET AT HEADACHE ONSET, MAY REPEAT IN 4-6 HOURS, MDD 3 TABS     Omega-3 Fatty Acids (FISH OIL) 1000 MG CAPS Take 1 capsule by mouth daily     Multiple Vitamin tablet Take 1 tablet by mouth daily        cholecalciferol (VITAMIN D) 1000 UNIT tablet Take 1,000 Units by mouth daily         fluticasone (FLONASE) 50 MCG/ACT nasal spray INSTILL 2 SPRAYS BY NASAL ROUTE DAILY     loratadine (CLARITIN) 10 MG tablet Take 10 mg by mouth daily 05/25/2012: PRN    epinephrine (EPIPEN) 0.3 MG/0.3ML DEVI Inject 0.3 mg into the muscle once as needed          No current facility-administered medications for this visit.        Patient Active Problem List   Diagnosis Code    Essential Hypercholesterolemia E78.00    Allergic Rhinitis J30.9    Vitamin D Deficiency E55.9    Migraine without aura and without status migrainosus, not intractable G43.009    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    Fibromyalgia M79.7    GERD (gastroesophageal reflux disease) K21.9    Insomnia G47.00    Dense breast R92.2       Review of Systems      As per HPI     Objective:    Visit Vitals    BP 100/62 (BP Location: Left arm, Patient Position: Sitting, Cuff Size: adult)    Pulse 88    Resp 20    Ht 1.568 m (5' 1.75")    Wt 64 kg (141 lb)    LMP 07/02/2015 (Exact Date)    BMI  26 kg/m2        Physical Exam   Constitutional: She is oriented to person, place, and time. She appears well-developed and well-nourished. No distress.   Eyes: EOM are normal. Pupils are equal, round, and reactive to light.   Cardiovascular: Normal rate.    Pulmonary/Chest: Effort normal.   Neurological: She is alert and oriented to person, place, and time.   5/5 upper & lower extremity strength bilaterally.  Cranial  nerves intact.  Good finger-to-nose coordination.  Negative Romberg.  Steady gait.   Psychiatric: She has a normal mood and affect.   Vitals reviewed.        Assessment / Plan:        Migraine    Pleasant patient presents with headache, photophobia and phonophobia, and unilateral blurred vision for ten days.  Symptoms are consistent with a migraine.  Suspect the change in hormonal medication triggered it.  No relief with NSAID's and triptan.  Prescribed a Medrol dose pak.  Advised not to take NSAID's concomitantly.  She can use extra strength Tylenol if needed.        -     methylPREDNISolone (MEDROL PAK) 4 MG tablet pack; Take according to package directions (6 day supply)      Patient will call if symptoms worsen or fail to improve      Patient Instructions   Prednisone as directed  No NSAID's while taking it (Naproxen, Ibuprofen)  May taken Tylenol if needed  [facility] of rest and fluids

## 2015-07-25 NOTE — Patient Instructions (Signed)
Prednisone as directed  No NSAID's while taking it (Naproxen, Ibuprofen)  May taken Tylenol if needed  [facility]

## 2015-07-25 NOTE — Telephone Encounter (Signed)
I spoke with Jasmine Nunez.  She uses naproxen occasionally.  I told her that I do not usually prescribe naproxen because it is available over-the-counter, and it may be more expensive to obtain as a prescription.  I suggested that she could use 220 mg tablets or capsules that she can buy over the counter and take two at onset and repeat one in four hours if needed.  She will try this.  She received a prescription for methylprednisolone from her PCP today because of persistent migraine.  She recently started hormonal therapy for peri-menopausal symptoms and she thinks that this may be the cause.  She will let me know if this does not help, or if she does not find the over-the-counter naproxen to be effective.

## 2015-07-31 ENCOUNTER — Telehealth: Payer: Self-pay | Admitting: Neurology

## 2015-07-31 DIAGNOSIS — G43901 Migraine, unspecified, not intractable, with status migrainosus: Secondary | ICD-10-CM

## 2015-07-31 MED ORDER — PREDNISONE 10 MG PO TABS *I*
ORAL_TABLET | ORAL | 0 refills | Status: DC
Start: 2015-07-31 — End: 2015-10-12

## 2015-07-31 NOTE — Telephone Encounter (Signed)
Patient calling requesting to speak with Dr. Latanya PresserMaroldo. Stated is in regards to her migraines, today she is feeling the worse and she took her Rx Naproxen, Ibuprofen

## 2015-07-31 NOTE — Telephone Encounter (Signed)
I spoke with Jasmine Nunez.  She did well for the first few days of her methylprednisolone dose pack, but once she started to lower her dose the migraines worsened.  She took naratriptan and acetaminophen yesterday and naproxen today.  She just feels lousy.  I suggested trying a course of prednisone again.  She is agreeable and I sent a prescription to her pharmacy.  She has wondered if starting hormonal therapy has triggered her migraines.  I suggested that she contact her gynecologist about this.  There is little risk to going off the hormones.  If her migraines improve, she could go back on them.  If her migraines worsen in that case, she has established that she is not tolerating it.  If her migraines do not improve off the hormones, she has established they are not likely the cause.

## 2015-08-09 ENCOUNTER — Telehealth: Payer: Self-pay

## 2015-08-09 DIAGNOSIS — G43009 Migraine without aura, not intractable, without status migrainosus: Secondary | ICD-10-CM

## 2015-08-09 NOTE — Telephone Encounter (Signed)
Prior Auth form for medication Naratriptan 2.5 mg tabs faxed to Dr Latanya PresserMaroldo for completion

## 2015-08-10 NOTE — Telephone Encounter (Signed)
Received Prior Auth form to be completed by Doctor. Put in Dr.'s mailbox.

## 2015-08-10 NOTE — Telephone Encounter (Signed)
I have not received the prior authorization paperwork in my mailbox and have not found it on our fax machine.  Please re-fax it.  Thanks.

## 2015-08-15 ENCOUNTER — Telehealth: Payer: Self-pay

## 2015-08-15 MED ORDER — NARATRIPTAN HCL 2.5 MG PO TABS *A*
2.5000 mg | ORAL_TABLET | ORAL | 11 refills | Status: DC | PRN
Start: 2015-08-15 — End: 2016-06-19

## 2015-08-15 NOTE — Addendum Note (Signed)
Addended by: Azucena KubaMAROLDO, Dasiah Hooley M on: 08/15/2015 11:58 AM     Modules accepted: Orders

## 2015-08-15 NOTE — Telephone Encounter (Signed)
Spoke with [facility] pharmacy patient picked up 9 pills on 08/08/15 that's why medication was rejecting, next fill is not till 09/04/15, they will reach out then if patient needs an auth or if they run into a problem

## 2015-08-15 NOTE — Telephone Encounter (Signed)
I reviewed the prior authorization work.  It seems the issue is one of quantity limit.  I changed her prescription to 8 tablets/30 days.  Please inform Jasmine Nunez and her pharmacy of the change.  Prior authorization should no longer be needed.  Thank you.

## 2015-09-14 ENCOUNTER — Telehealth: Payer: Self-pay | Admitting: Neurology

## 2015-09-14 NOTE — Telephone Encounter (Signed)
Pt notified of Dr.Maroldo's information below - re: 8 tablets of naratriptan (AMERGE).  Message left on patient's name specific Voice Mail and permission given in [place]

## 2015-09-14 NOTE — Telephone Encounter (Signed)
Patient is calling and states she was given only 8 naratriptan (AMERGE) 2.5 MG tablet and she normally gets 9.

## 2015-09-14 NOTE — Telephone Encounter (Signed)
Please let her know that her insurance company imposed a quantity limit of 8 tablets.  I had to change the prescription from 9 tablets.  Thanks.

## 2015-10-12 ENCOUNTER — Other Ambulatory Visit: Payer: Self-pay

## 2015-10-12 ENCOUNTER — Encounter: Payer: Self-pay | Admitting: Primary Care

## 2015-10-12 ENCOUNTER — Ambulatory Visit: Payer: 59 | Attending: Primary Care | Admitting: Primary Care

## 2015-10-12 VITALS — BP 102/60 | HR 65 | Temp 97.9°F | Resp 18 | Ht 61.75 in | Wt 142.0 lb

## 2015-10-12 DIAGNOSIS — M542 Cervicalgia: Secondary | ICD-10-CM

## 2015-10-12 DIAGNOSIS — S134XXA Sprain of ligaments of cervical spine, initial encounter: Secondary | ICD-10-CM
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
## 2015-10-16 ENCOUNTER — Telehealth: Payer: Self-pay | Admitting: Primary Care

## 2015-10-16 DIAGNOSIS — S134XXA Sprain of ligaments of cervical spine, initial encounter: Secondary | ICD-10-CM

## 2015-10-16 DIAGNOSIS — M542 Cervicalgia: Secondary | ICD-10-CM

## 2015-10-16 MED ORDER — CYCLOBENZAPRINE HCL 5 MG PO TABS *I*
5.0000 mg | ORAL_TABLET | Freq: Three times a day (TID) | ORAL | 1 refills | Status: DC | PRN
Start: 2015-10-16 — End: 2016-01-10

## 2015-10-16 NOTE — Telephone Encounter (Signed)
Left voice mail message for pt to please call the office. toconnor lpn

## 2015-10-16 NOTE — Telephone Encounter (Signed)
Please let patient know script for Flexeril was sent to the pharmacy  She can take this up to three times daily as needed along with an anti-inflammatory and heat  May cause drowsiness, so she shouldn't drink until she knows how the medication affects her

## 2015-10-16 NOTE — Telephone Encounter (Signed)
Spoke with pt per result note below. toconnor lpn

## 2015-10-16 NOTE — Telephone Encounter (Signed)
Spoke with pt per result note below. Pt will ry PT ,given phone number to [facility] in [place]. Pt did not want the Rx for a muscle relaxant at the time of visit, pt would like to try muscle relaxant at this time. toconnor lpn
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
## 2015-10-24 ENCOUNTER — Encounter: Payer: Self-pay | Admitting: Primary Care

## 2015-10-25 ENCOUNTER — Encounter: Payer: Self-pay | Admitting: Gastroenterology

## 2015-11-30 ENCOUNTER — Other Ambulatory Visit: Payer: Self-pay | Admitting: Primary Care

## 2015-11-30 DIAGNOSIS — S134XXA Sprain of ligaments of cervical spine, initial encounter: Secondary | ICD-10-CM

## 2015-11-30 MED ORDER — NON-SYSTEM MEDICATION *A*
1 refills | Status: DC
Start: 2015-11-30 — End: 2016-01-11

## 2015-12-18 ENCOUNTER — Ambulatory Visit: Payer: Self-pay | Admitting: Neurology

## 2015-12-25 ENCOUNTER — Other Ambulatory Visit: Payer: Self-pay | Admitting: Primary Care

## 2015-12-27 ENCOUNTER — Encounter: Payer: Self-pay | Admitting: Primary Care

## 2015-12-27 NOTE — Telephone Encounter (Signed)
See patient's chart.

## 2015-12-28 ENCOUNTER — Encounter: Payer: Self-pay | Admitting: Primary Care

## 2015-12-31 MED ORDER — NON-SYSTEM MEDICATION *A*
0 refills | Status: DC
Start: 2015-12-31 — End: 2016-01-11

## 2016-01-03 ENCOUNTER — Encounter: Payer: Self-pay | Admitting: Primary Care

## 2016-01-07 ENCOUNTER — Ambulatory Visit: Payer: 59 | Attending: Primary Care

## 2016-01-07 DIAGNOSIS — Z23 Encounter for immunization: Secondary | ICD-10-CM

## 2016-01-10 ENCOUNTER — Encounter: Payer: Self-pay | Admitting: Primary Care

## 2016-01-10 ENCOUNTER — Ambulatory Visit: Payer: 59 | Attending: Primary Care | Admitting: Primary Care

## 2016-01-10 VITALS — BP 112/74 | HR 80 | Ht 61.75 in | Wt 142.6 lb

## 2016-01-10 DIAGNOSIS — M7701 Medial epicondylitis, right elbow: Secondary | ICD-10-CM

## 2016-01-10 DIAGNOSIS — R200 Anesthesia of skin: Secondary | ICD-10-CM
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
## 2016-01-10 NOTE — Patient Instructions (Signed)
Medial Epicondylitis Exercises    About this topic   The elbow is where your upper arm bone meets the two lower bones in your arm. There is a bump on the inside of your elbow at the bottom of your upper arm bone. It is the medial epicondyle. A few tendons attach here. Tendons attach muscles to bone. These muscles are used to curl your wrist and fingers down.  When these tendons get sore, swollen, and torn from overuse you have medial epicondylitis. It is also called golfer's elbow. This is a common problem in golf players. It may also happen with other activities or sports. Exercises can help to make this problem better.  General   Before starting with a program, ask your doctor if you are healthy enough to do these exercises. Your doctor may have you work with a trainer or physical therapist to make a safe exercise program to meet your needs.  Stretching Exercises   Stretching exercises keep your muscles flexible. They also stop them from getting tight. Start by doing each of these stretches 2 to 3 times. In order for your body to make changes, you will need to hold these stretches for 20 to 30 seconds. Try to do the stretches 2 to 3 times each day. Do all exercises slowly.  Wrist stretches bending back ? Straighten your elbow and have your palm facing down. Keeping your right elbow straight, bend your hand and fingers down so that your fingers are now pointing to the floor. Grab your hand with your other hand and push back the wrist further until you feel a stretch.  Strengthening Exercises   Strengthening exercises keep your muscles firm and strong. Start by repeating each exercise 2 to 3 times. Work up to doing each exercise 10 times. Try to do the exercises 2 to 3 times each day. Do all exercises slowly.  Some exercises can be done holding a small weight. You can use a can of soup or a hand weight. If the exercise gets too easy, use heavier weights or do the exercise more times.  Wrist exercises with your  lower arm supported on a table or your leg. Your hand should be off the edge:  Flexions ? Have your palm facing UP with a small weight in your hand. Bend your wrist up as far as you can. Now, lower the weight back down. Be sure to control the weight as you lower it. Repeat using other hand.  Extensions ? Have your palm facing DOWN with a small weight in your hand. Bend your wrist back as far as you can. Now, lower the weight back down. Be sure to control the weight as you lower it. Repeat using other hand.  Side-to-side ? Position your hand SIDEWAYS with your thumb up. With a weight in your hand, lift your hand straight up. Now, lower your hand back down. Repeat using other hand.  Lower arm twists ? Start by having your elbow bent at your side. Hold a small weight in your hand. Keeping your elbow bent at your side, turn the lower arm until your palm is facing down. Now, turn the lower arm until your palm is facing up. Repeat using other hand.  Biceps curls standing ? Bend your elbow and slowly move your lower arm towards your chest. Do not move your shoulder as you move your lower arm. Breathe out when you do this motion. Hold this position for 2 seconds. Lower your lower arm to   the starting position. Do not lock your elbow. Always keep a slight bend in your elbow. Breathe in when lowering your lower arm. Repeat the exercise on the other arm. You may exercise both arms at the same time. You can also do this exercise with different hand positions. Try gripping the weights with thumbs up or palms facing down to get all parts of the biceps muscle.  Ball squeezes ? Gently squeeze a tennis ball 10 times. If you have no pain, squeeze with more pressure. Repeat using other hand.  Golf swings ? Once your pain has lessened and you are almost ready to return to the golf course, try swinging the golf club without using a ball. This will get you used to the weight of the golf club.  Your doctor or therapist may also have you  use a rubber bar or Flex bar for exercises to help your medial epicondylitis.           What will the results be?   Less pain and swelling  Increased strength  Better range of motion  Greater ease doing arm activities  Helpful tips   Stay active and work out to keep your muscles strong and flexible.  Keep a healthy weight to avoid putting too much stress on your joints. Eat a healthy diet to keep your muscles healthy.  Be sure you do not hold your breath when exercising. This can raise your blood pressure. If you tend to hold your breath, try counting out loud when exercising. If any exercise bothers you, stop right away.  Always warm up before stretching. Heated muscles stretch much easier than cool muscles. Stretching cool muscles can lead to injury.  Try walking and swinging your arms at an easy pace for a few minutes to warm up your muscles. Do this again after exercising.  Never bounce when doing stretches.  Doing exercises before a meal may be a good way to get into a routine.  If you are using weights, choose a weight that will allow you to repeat the exercise 10 times before resting. If you easily do 10 repeats, you may not be using enough weight. If you are not able to do 10 repeats, you are using too heavy of a weight.  Exercise may be slightly uncomfortable, but you should not have sharp pains. If you do get sharp pains, stop what you are doing. If the sharp pains continue, call your doctor.  Where can I learn more?   [employer]  http://orthoinfo.aaos.org/topic.cfm?topic=a00137   Last Reviewed Date   2013-04-14  Consumer Information Use and Disclaimer   This information is not specific medical advice and does not replace information you receive from your health care provider. This is only a brief summary of general information. It does NOT include all information about conditions, illnesses, injuries, tests, procedures, treatments, therapies, discharge instructions or  life-style choices that may apply to you. You must talk with your health care provider for complete information about your health and treatment options. This information should not be used to decide whether or not to accept your health care provider’s advice, instructions or recommendations. Only your health care provider has the knowledge and training to provide advice that is right for you.  Copyright   Copyright © 2015 [employer] and its affiliates and/or licensors. All rights reserved.        Medial Epicondylitis    About this topic   The   elbow is where your upper arm bone meets the two lower bones in your arm. There is a bump on the inside of your elbow at the bottom of your upper arm bone. It is the medial epicondyle. A few tendons attach here. Tendons attach muscles to bone. These muscles are used to curl your wrist and fingers down.  When these tendons get sore, swollen, and torn from overuse you have medial epicondylitis. It is also called golfer's elbow. This is a common problem in golf players. It may also happen with other activities or sports.     What are the causes?   · Using the muscles in your lower arm too much. This could be from doing the same action over and over like using a hammer, shovel, or golf club.  · Injury or a direct hit to the tendon   What can make this more likely to happen?   · Playing sports and using poor techniques  · Doing things that use repeated hand movements like painting, raking, chopping wood, hammering, or shoveling  · Being female and aged 20 to 49  · Being an older adult  · Having muscle tightness or lack of strength  What are the main signs?   · Pain and soreness at the inside of the elbow  ¨ May extend down the palm side of your forearm  ¨ Worse with movements like swinging a golf club, shaking hands, or turning a doorknob  · Weakness in the hands or wrist  How does the doctor diagnose this health problem?   Your doctor will feel all  over your elbow, lower arm, and wrist. Your doctor may have you put your arm in many positions. The doctor may push and pull on your wrist and elbow to check motion and strength. The doctor may order:  · X-ray  · MRI scan  How does the doctor treat this health problem?   · Rest  · Ice  · Keeping the elbow raised  · Brace or strap around the elbow to lessen the stress on the tendon  · Heat may be used later but not right away. Heat can make swelling worse.  · Exercises  · Physical therapy (PT) ? may include ultrasound, massage, and exercises  · Surgery is only needed when there is very bad damage to the tendon or if no other treatment works after 6 to 12 months.  What drugs may be needed?   The doctor may order drugs to:  · Help with pain and swelling  The doctor may give you a shot of an anti-inflammatory drug called a corticosteroid. This will help with swelling. Talk with your doctor about the risks of this shot.  What problems could happen?   · Long-term elbow pain  · Injury returns  · Weak and tight muscles  What can be done to prevent this health problem?   · Stay active and work out to keep your muscles strong and flexible.  · Warm up slowly and stretch before you exercise. Use good training methods and form for sports. Have an expert look at your technique.  · When working out with weights, try to keep your elbow slightly bent instead of straightened all the way. Avoid lifting objects when your elbow is fully straightened.  · Take breaks often when doing things that use repeat movements.  · Take extra care when playing sports. Ask an expert for help when choosing equipment.   · Use a hammer   with extra padding. Use two hands when using heavy tools.  · Keep a healthy weight so there is not extra stress on your joints.  Where can I learn more?   American Academy [employer] Orth[employer]geons  http://orthoinfo.aaos.org/topic.cfm?topic=A00137   Last Reviewed Date   2011-05-19  Consumer Information Use and Disclaimer      This information is not specific medical advice and does not replace information you receive from your health care provider. This is only a brief summary of general information. It does NOT include all information about conditions, illnesses, injuries, tests, procedures, treatments, therapies, discharge instructions or life-style choices that may apply to you. You must talk with your health care provider for complete information about your health and treatment options. This information should not be used to decide whether or not to accept your health care provider’s advice, instructions or recommendations. Only your health care provider has the knowledge and training to provide advice that is right for you.  Copyright   Copyright © 2015 [employer] and its affiliates and/or licensors. All rights reserved.

## 2016-01-11 ENCOUNTER — Encounter: Payer: Self-pay | Admitting: Neurology

## 2016-01-11 ENCOUNTER — Ambulatory Visit: Payer: Self-pay | Admitting: Neurology

## 2016-01-11 VITALS — Ht 61.5 in | Wt 142.7 lb

## 2016-01-11 DIAGNOSIS — G43719 Chronic migraine without aura, intractable, without status migrainosus: Secondary | ICD-10-CM

## 2016-01-11 NOTE — Progress Notes (Signed)
I saw Jasmine Nunez in the [facility] on 01/11/2016 for follow-up evaluation of migraine without aura.  I last saw her  on 12/18/2014.    HPI: She tells me that she was painting an outdoor playset in June and thinks she worked too intensely.  She has pain in the outside of her elbow with certain activities or movements.  She has ongoing numbness and tingling in the fourth and fifth digits in both hands.  She tripped and fell over in a hole the other night.  The next morning she woke up and it felt like she stepped on a marshmallow under her right foot.  The outside of the foot was bruised, but she had no pain.  She is getting migraines "really often".  She has been keeping track since the spring.  She has had between 9 and 12 each month since April, and so far this month she has had 7.  Her migraines seem to last for days, or recur multiple days in a row.  She thinks that she probably has a headache close to half of the days of every month.  Yesterday was really bad. She took the naratriptan, and she felt way worse.  She thinks naratriptan is helpful in general, but when her headache is bad she has to take naproxen or acetaminophen with it.  She thinks that she has more bad days than good days each month.  Her husband lost his job this week, and she anticipates things are going to worsen.  Their marriage is in a better place than it had been.  They have participated in marriage counseling a couple of different times.  Her migraines had been infrequent and more manageable until she was under considerable stress when he made financial investments that did not work out.  "I am horrible at managing stress."  She is very busy and takes very little quiet time, even when she does not feel well.  When she stops to think about, she has a lot of stress in her life, probably more than she would expect for someone her age.  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      The active  problem list, past medical/surgical history, medications, allergies, social history, and family history were reviewed with the patient and/or caregiver.  I documented/updated these history elements and marked them as reviewed in the appropriate sections of the electronic health record.    REVIEW OF SYSTEMS:  She completed a 15 point review of systems questionnaire that I reviewed today and that will be scanned into the electronic health record.     VITALS:  Vitals:    01/11/16 1554   Weight: 64.7 kg (142 lb 11.2 oz)   Height: 1.562 m (5' 1.5")   Body mass index is 26.53 kg/(m^2).     EXAMINATION:  No formal examination was performed today.     IMPRESSION/DISCUSSION:   This is a 45 year old woman with chronic, intractable migraine without aura.  I spent 45 minutes with Mrs. Commons today, more than 50% of which was spent in counseling and coordination of care.  We discussed management of her migraines.  She told me that she was reluctant to tell me how things have been going because she does not want to be put on an anti-depressant.  I explained that the decision to start a medication for migraine prevention is hers, not mine, and that there are options other than anti-depressants.  That said, she has tried multiple medications for the purpose of migraine prevention but experienced adverse effects with each: amitriptyline, fluoxetine, gabapentin, topiramate, and propranolol.  I do not anticipate that a trial of divalproex will be effective or tolerable.  I would not suggest venlafaxine because she is resistant to anti-depressant medications. Naratriptan is no longer an effective acute therapy for there.  She has tried multiple other abortive therapies without benefit: sumatriptan, eletritpan, rizatriptan, naproxen, and hydrocodone-acetaminophen.  Her preference is to have adequate numbers of tablets of an abortive therapy so that she could treat all of her migraines acutely, but is less interested in making  interventions to diminish her migraine frequency.    We discussed lifestyle and behavioral approaches to migraine management, and I explained that these can be more helpful over the long-term than medications.  We discussed the things in her life that cause her stress.  We discussed her lack of adequate mechanisms for coping with stress.  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  She finds running to be the one thing that consistently helps her to relax, and I encouraged her to do this.  We discussed the potential beneficial and adverse effects of botulinum toxin therapy for migraine prevention.  I explained that this should be viewed as another treatment option that is not guaranteed to be effective and will not be a cure for her migraines.  She is agreeable to meeting with one of my colleagues in the [facility] for consideration of botulinum toxin therapy.      PLAN:   We are not making medication changes today.   I placed a referral to the [facility] for evaluation and recommendations for management.   We will schedule a tentative follow-up in six months.  I would be happy to see her sooner.  If she opts to transfer care to one of the [facility] providers, she can cancel her follow-up with me.      Azucena KubaAnthony M. Wilbon Obenchain, MD  Assoc. Professor of Clinical Neurology  Pager: [phone]  Office: [phone]  01/11/2016 2:28 PM

## 2016-01-17 ENCOUNTER — Encounter: Payer: Self-pay | Admitting: Gastroenterology

## 2016-01-21 ENCOUNTER — Other Ambulatory Visit: Payer: Self-pay | Admitting: Primary Care

## 2016-02-12 ENCOUNTER — Encounter: Payer: Self-pay | Admitting: Primary Care

## 2016-02-12 NOTE — Telephone Encounter (Signed)
See patient's chart.

## 2016-03-03 ENCOUNTER — Ambulatory Visit: Payer: Self-pay | Admitting: Neurology

## 2016-03-11 ENCOUNTER — Encounter: Payer: Self-pay | Admitting: Family Medicine

## 2016-03-11 ENCOUNTER — Ambulatory Visit: Payer: 59 | Attending: Primary Care | Admitting: Family Medicine

## 2016-03-11 VITALS — BP 122/74 | HR 74 | Resp 20 | Ht 61.75 in | Wt 145.8 lb

## 2016-03-11 DIAGNOSIS — I781 Nevus, non-neoplastic: Secondary | ICD-10-CM

## 2016-03-11 LAB — PCMH DEPRESSION ASSESSMENT

## 2016-03-11 MED ORDER — METRONIDAZOLE 0.75 % EX GEL *I*
Freq: Two times a day (BID) | CUTANEOUS | 1 refills | Status: AC
Start: 2016-03-11 — End: ?

## 2016-03-11 NOTE — Progress Notes (Signed)
Subjective:     Patient ID: Jasmine Nunez is a 45 y.o. female.    HPI Comments: Patient presents for evaluation of a skin lesion  Noticed a red spot on her nose 2-3 months ago  No associated pain, itching, or discomfort  Doesn't think it has changed at all; ?a little less red  She reports a history of rosacea, mostly occurring on cheeks  She treats this with skin care products, no prescriptions  Doesn't have a dermatologist  Has a history of severe sun burns as a child  Wears sunscreen consistently now  No personal or FH of skin cancers      Allergies, medications, and problem list were reviewed with the patient and updated in Epic as appropriate.    Allergy History as of 03/11/16     NO KNOWN DRUG ALLERGY       Noted Status Severity Type Reaction    11/01/10 0850 Sharman CrateMacDonald, Nicole C 03/08/04 Deleted       10/16/10 1629 Benito MccreedySwain, Victoria M 03/08/04 Active       07/15/10 1632 Edi Conversion, Allergies 03/08/04 Active       Comments:  Created by Conversion - 0;            NO KNOWN LATEX ALLERGY       Noted Status Severity Type Reaction    11/01/10 0850 Rodena MedinMacDonald, Nicole C 03/08/04 Deleted       10/16/10 1629 Benito MccreedySwain, Victoria M 03/08/04 Active       07/15/10 1632 Edi Conversion, Allergies 03/08/04 Active       Comments:  Created by Conversion - 0;            ENVIRONMENTAL ALLERGIES       Noted Status Severity Type Reaction    12/07/12 1053 Azucena KubaMaroldo, Anthony M, MD 12/04/10 Deleted  Allergy     Comments:  spring, summer, fall pollens     12/04/10 1611 Nadara EatonMarcus, Carolina, MD 12/04/10 Active       Comments:  Cats, dogs, spring, summer, fall pollens           FOOD ALLERGY FORMULA       Noted Status Severity Type Reaction    08/29/13 0911 Paticia Stackconnor, Tracy A, LPN 54/11/8107/05/12 Active High Allergy Anaphylaxis    Comments:  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            AMITRIPTYLINE       Noted Status Severity  Type Reaction    06/10/11 1352 Azucena KubaMaroldo, Anthony M, MD 06/10/11 Active  Intolerance Other (See Comments)    Comments:  Worsened depression           FLUOXETINE       Noted Status Severity Type Reaction    06/10/11 1352 Azucena KubaMaroldo, Anthony M, MD 06/10/11 Active  Intolerance Other (See Comments)    Comments:  Over stimulation           HYDROCODONE       Noted Status Severity Type Reaction    03/15/12 0923 Azucena KubaMaroldo, Anthony M, MD 03/15/12 Active  Intolerance Other (See Comments)    Comments:  Lightheadedness             GABAPENTIN       Noted Status Severity Type Reaction    11/26/12 1751 Azucena KubaMaroldo, Anthony M, MD 11/26/12 Active  Low Intolerance Other (See Comments)    Comments:  Fatigue, bloating, cognitive changes           TOPIRAMATE       Noted Status Severity Type Reaction    12/07/12 1055 Azucena Kuba, MD 12/07/12 Active Low Intolerance Other (See Comments)    Comments:  Cognitive slowing           PROPRANOLOL       Noted Status Severity Type Reaction    06/06/13 1039 Azucena Kuba, MD 06/06/13 Active Low Intolerance Other (See Comments)    Comments:  Worsened migraines                 Current Outpatient Prescriptions   Medication Sig    fluticasone (FLONASE) 50 MCG/ACT nasal spray INSTILL 2 SPRAYS IN EACH NOSTRIL EVERY DAY    Non-System Medication Topical progesterone with rose hip oil    naratriptan (AMERGE) 2.5 MG tablet Take 1 tablet (2.5 mg total) by mouth as needed   May repeat once after 4 hours    naproxen (ALEVE) 220 MG capsule Take 440 mg by mouth daily as needed for Headaches    loratadine (CLARITIN) 10 MG tablet Take 10 mg by mouth daily as needed       Omega-3 Fatty Acids (FISH OIL) 1000 MG CAPS Take 1 capsule by mouth daily    Multiple Vitamin tablet Take 1 tablet by mouth daily       cholecalciferol (VITAMIN D) 1000 UNIT tablet Take 1,000 Units by mouth daily        epinephrine (EPIPEN) 0.3 MG/0.3ML DEVI Inject 0.3 mg into the muscle once as needed         No current  facility-administered medications for this visit.        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 She appears well-developed and well-nourished. No distress.   Cardiovascular: Normal rate.    Pulmonary/Chest: Effort normal.   Neurological: She is alert and oriented to person, place, and time.   Skin:   Nonpalpable thread-like, erythematous lesion measuring approximately 1-mm in diameter on the nose   Psychiatric: She has a normal mood and affect.   Vitals reviewed.        Assessment / Plan:        Telangiectasia of face    Patient presents for evaluation of a small, otherwise asymptomatic erythematous skin lesion on her nose for 2-3 months.  Exam is consistent with a telangiectasia and likely secondary to rosacea.  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  Offered a referral to dermatology, but she declines at this time    -     metroNIDAZOLE (METROGEL) 0.75 % gel; Apply topically 2 times daily      Patient to call the office with any questions or concerns

## 2016-03-13 ENCOUNTER — Encounter: Payer: Self-pay | Admitting: Neurology

## 2016-03-13 ENCOUNTER — Telehealth: Payer: Self-pay

## 2016-03-13 ENCOUNTER — Ambulatory Visit: Payer: Self-pay | Admitting: Neurology

## 2016-03-13 VITALS — BP 122/69 | HR 85 | Ht 61.75 in | Wt 144.0 lb

## 2016-03-13 DIAGNOSIS — G43719 Chronic migraine without aura, intractable, without status migrainosus: Secondary | ICD-10-CM

## 2016-03-13 NOTE — Telephone Encounter (Signed)
[facility] [employer] [phone]), NO AUTHORIZATION NEEDED FOR NERVE BLOCK ([id number], [id number]).

## 2016-03-13 NOTE — Patient Instructions (Signed)
You can take naratriptan with naproxen 500mg  for acute treatment of your migraines.  Take this at the onset of your migraine.    Do not take this more than two to three days a week.    We will obtain insurance approval for nerve blocks for prevention of your migraines.

## 2016-03-13 NOTE — Progress Notes (Signed)
[facility] New Patient Visit    Jasmine Nunez  is a  45 y.o.  year old right handed woman who is seen today in [facility]. She is referred by Dr. Elzie Ringsony Maroldo for neurologic consultation for evaluation of headaches.    Her headaches first started where she could not function was around age 45.  They got worse exponentially about 7 years ago and that was when she first started seeking care for them.    She had gone through a financial stressor with her husband around that time.  She never improved after 1.5 years so she eventually sought medical care for her headaches.    She was started on Prozac by Dr. Porfirio Mylaricilline at that time.  This did not help her headaches. She had difficulty sleeping and felt depressed.    Her headaches will start with her left eye being blurry. She will feel like she needs to be active.  The pain will come on gradually throughout the day. Her whole body will be painful and aching. She has difficulty thinking and has a foggy brain.  Her vision will be blurry. Her pain will be left sided into the back of her neck. It will stay on the left side of her head. She may also have sinus pain on awakening at times and her head will feel like it will blow up.    She has no aura.  She has photophobia, phonophobia with some nausea, never vomiting. She rates her pain as an 8/10 on a VAS.  Physical activity makes the pain worse generally.  She is having headaches since doing PT.  She has about 15 headache days per month on average.    Triggers include stress, sudden changes in weather, ovulation and just before her menses, and her menses.  Associated symptoms include increased appetite, left eye blurry vision, irritability, fatigue and somnolence.  She is unaware of any family history of migraines, but her father's side health history is unknown.  She has no history of head trauma.  Sleep is pretty good.  She has noticed a decreased need for sleep. She will sleep between 10am and 11pm and  awaken by 6am/6:30am.  Caffeine intake include one cup of coffee a day, occasionally 2 cups.  Exercise- not been good this month, typically twice a week with cardio and weights.  She is better with fluid intake - includes 2 to 3 glasses of water a day.    She has no h/o abuse.    She has a left lazy eye which may become more pronounced at times.  She has no autonomic signs such as ptosis, miosis, conjunctival injection, rhinorrhea, increased lacrimation.    She has never been to UC or the ED due to headaches.      MIDAS  Number of days in the last 3 months where headaches prevented the patient from going to school/work: 0  Number of days in the last 3 months (excluding those recorded above) where headaches reduced school/work productivity by half or more: 0  Number of days in the last 3 months where headaches prevented the patient from doing housework.: 7  Number of days in the last 3 months (excluding those recorded above) where headaches reduced household productivity by half or more: 10  Number of days in the last 3 months where headaches prevented the patient from attending family, social or leisure activites: 0  Total: 17    0-5: little or no disability  6-10: mild disability  11-20: moderate disability  21+: severe disability    Current acute medications: naproxen, naratriptan, acetaminophen  Current preventive medications: None    She has tried the following medications with the associated response, no relief, unless otherwise specified:  Amitriptyline- caused depression/affected her mood  Fluoxetine- insomnia  Gabapentin- felt drunk  topiramate- cognitive slowing  Propranolol- no relief    Sumatriptan  Eletriptan  Rizatriptan  Naproxen  Hydrocodone-acetaminophen    Neuroradiology: 11/13 MRI brain with contrast I personally reviewed and was normal.    Social history: She is married and has 2 children, ages 589 and 587 and is a SAHM. 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    Family History   Problem  Relation Age of Onset    Conversion Other      [id number]^Coronary Artery [id number]^191isease^414.00^Active^mid 550s    Asthma Mother     Heart failure Mother     Diabetes Mother     Neuropathy Mother     Alcohol abuse Mother     Asthma Sister     Allergic rhinitis Sister     Migraines Sister     Cancer Maternal Grandmother     Heart failure Maternal Grandmother     Diabetes Maternal Grandmother     Depression Maternal Grandmother     Alzheimer's disease Other      aunt    Schizophrenia Father     Drug abuse Father          ROS: 13 systems were reviewed via ROS questionnaire. Please see scanned questionnaire in the EHR media tab.     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    Fibromyalgia M79.7    GERD (gastroesophageal reflux disease) K21.9    Insomnia G47.00    Dense breast R92.2     Current Outpatient Prescriptions   Medication Sig    metroNIDAZOLE (METROGEL) 0.75 % gel Apply topically 2 times daily    fluticasone (FLONASE) 50 MCG/ACT nasal spray INSTILL 2 SPRAYS IN EACH NOSTRIL EVERY DAY    Non-System Medication Topical progesterone with rose hip oil    naratriptan (AMERGE) 2.5 MG tablet Take 1 tablet (2.5 mg total) by mouth as needed   May repeat once after 4 hours    naproxen (ALEVE) 220 MG capsule Take 440 mg by mouth daily as needed for Headaches    loratadine (CLARITIN) 10 MG tablet Take 10 mg by mouth daily as needed       Omega-3 Fatty Acids (FISH OIL) 1000 MG CAPS Take 1 capsule by mouth daily    Multiple Vitamin tablet Take 1 tablet by mouth daily       epinephrine (EPIPEN) 0.3 MG/0.3ML DEVI Inject 0.3 mg into the muscle once as needed        cholecalciferol (VITAMIN D) 1000 UNIT tablet Take 1,000 Units by mouth daily           Neurologic exam:    Well  developed, well nourished woman in no distress.  Comfortable appearing.   Lungs were clear to auscultation without any wheezes, rales, or rhonchi.  Heart had a regular rate and rhythm without any murmurs, gallops, or rubs.  Carotids showed no bruits.  Neck was supple with full range  of motion and no meningismus.    Mental status: Alert and oriented x 3.  Able to name, read, and repeat.  Registration was 3/3 and 3/3 with recall at 5 minutes.  Mood and affect were appropriate.  Short-term and long-term memory were intact.    Cranial nerves: Visual acuity was J1 OD and OS with correction. Fundi were sharp and flat.  Versions were full without any nystagmus.  Visual fields were full to confrontation.  Pupils reacted from 3 to 2 mm bilaterally.  There was no afferent pupillary defect.  Sensation was intact to light touch in all 3 distributions.  There is no facial asymmetry or weakness.  Hearing was intact to finger rub.  Palate elevated symmetrically and tongue was midline.    Motor: Normal bulk and tone.  There was no pronator drift.  Full strength proximally and distally.  There were no tremors, myoclonus, or dyskinesias. Able to rise from a seated position without using arms and do a deep knee bend.     Coordination: Finger-to-nose and heel-to-shin were intact without dysmetria.    Reflexes: +2 at biceps, brachioradialis, triceps, patellars, and Achilles.  Toes are bilaterally downgoing.    Sensory: Intact light touch, pin, vibration. Romberg negative.    Gait: Intact heel, toe, and tandem gait.  Casual gait was normal.       Impression/Plan: Chronic migraine without aura.  A total of 50 minutes was spent with the patient with >50% of time spent on counseling and coordination of care discussing lifestyle modifications, identification of triggers, appropriate use of acute medications.    I advised the patient to keep a headache diary noting the frequency and severity of the headaches on a daily basis.  This should be  brought to the next follow-up visit for review.    I also advised on lifestyle modifications to help improve headaches such as getting regular sleep, eating regular meals, exercising, limiting caffeine, alcohol and other drugs, and reducing stress.      I also discussed identifying triggers and avoidance of these triggers.      I advised taking naratriptan with naproxen 500mg  for acute treatment. This should not be used more than 2 to 3 days per week at most.  I discussed with her treatment options- including Cephaly device, nerve blocks, and potentially Botox.    We will obtain insurance approval for nerve blocks as she is interested in pursuing this rather than daily preventive medications at this time.    Thank you for referring your patient. Please call with any questions or concerns.    Arthor Captain MD, MPH  [facility]  [phone]

## 2016-03-20 ENCOUNTER — Telehealth: Payer: Self-pay

## 2016-03-20 NOTE — Telephone Encounter (Signed)
[facility] [employer] [phone]), NO AUTHORIZATION NEEDED FOR NERVE BLOCK ([id number], [id number]).

## 2016-03-27 ENCOUNTER — Ambulatory Visit: Payer: Self-pay | Admitting: Neurology

## 2016-03-27 ENCOUNTER — Encounter: Payer: Self-pay | Admitting: Neurology

## 2016-03-27 VITALS — BP 129/73 | HR 82 | Ht 61.75 in | Wt 143.0 lb

## 2016-03-27 DIAGNOSIS — G43719 Chronic migraine without aura, intractable, without status migrainosus: Secondary | ICD-10-CM

## 2016-03-27 MED ORDER — BUPIVACAINE HCL 0.5 % IJ SOLUTION *WRAPPED*
20.0000 mL | Freq: Once | INTRAMUSCULAR | Status: AC
Start: 2016-03-27 — End: 2016-03-28

## 2016-03-27 NOTE — Procedures (Signed)
Jasmine Nunez  Is here at the [facility] for nerve blocks for chronic, intractable migraine.  She was seen here for her initial visit on 03/13/16.    We are trying nerve blocks for treatment of her intractable migraine today.  She does have a migraine today and in general her migraines are located occipitally, in the left temporo-pareital areas.  She also gets pain across her forehead which she has today, especially for the severe ones.    Current acute medications: naratriptan, naproxen  Current preventive medications: none    A time out was taken to verity the patient's identity by confirming the patient's first and last name and date of birth, verified with the ID band.  The correct procedure and site was confirmed with the patient.    Greater occipital and lesser occipital nerve blocks  The purpose, benefits, risks, and complications were explained to the patient.  Questions were answered and written informed consent was obtained.      Greater occipital nerve region was palpated bilaterally.  Tenderness to palpation was illicited bilaterally.  The sites of injection were prepped with alcohol swabs. 4cc of 0.50% bupivacaine was injected using 25 gauge needle in 2 locations on the left and in 2 locations on the right.  Hemostasis was achieved. Numbness was achieved in the area shortly after injections were completed.     A total of 4cc of 0.50% bupivacaine injected on each side.     Patient tolerated procedure well with no complications.    Auriculotemporal nerve block  The purpose, benefits, risks, and complications were explained to the patient.  Questions were answered and written informed consent was obtained.      Auriculotemporal region was palpated bilaterally.  Tenderness to palpation was illicited bilaterally.  Area was prepped with alcohol swabs.  1 cc of 0.50% bupivacaine was injected using a 30 gauge needle in 1 location on the right and 1 location on the left in the region of the  auriculotemporal nerves. Hemostasis was achieved.  Numbness was achieved in the area shortly after injections were completed.     A total of 1 cc of 0.50% bupivacaine was injected on each side.     Patient tolerated procedure well with no complications.      Supraorbital/supratrochlear nerve block  The purpose, benefits, risks, and complications were explained to the patient.  Questions were answered and written informed consent was obtained.      Supraorbital nerve region was palpated bilaterally.  Tenderness to palpation was illicited bilaterally.  Area was prepped with alcohol swabs. 0.8cc of 0.50% bupivacaine was injected using 30 gauge needle in 1 location on the right and 1 location on the left in the region of the supraorbital and supratrochlear nerves.  Hemostasis was achieved. Numbness was achieved in the area shortly after injections were completed.     A total of 0.8 cc of 0.50% bupivacaine was injected on each side.     Patient tolerated procedure well with no complications.    I will see her in f/u in 2 months.

## 2016-05-01 ENCOUNTER — Encounter: Payer: Self-pay | Admitting: Radiology

## 2016-05-02 ENCOUNTER — Telehealth: Payer: Self-pay | Admitting: Neurology

## 2016-05-02 NOTE — Telephone Encounter (Signed)
Placed coverage determination form in providers mailbox for review/completion.

## 2016-06-02 ENCOUNTER — Encounter: Payer: Self-pay | Admitting: Primary Care

## 2016-06-02 ENCOUNTER — Other Ambulatory Visit: Payer: Self-pay | Admitting: Primary Care

## 2016-06-02 DIAGNOSIS — Z1239 Encounter for other screening for malignant neoplasm of breast: Secondary | ICD-10-CM

## 2016-06-05 ENCOUNTER — Ambulatory Visit: Payer: 59 | Attending: Neurology | Admitting: Neurology

## 2016-06-05 ENCOUNTER — Encounter: Payer: Self-pay | Admitting: Neurology

## 2016-06-05 VITALS — BP 118/72 | HR 77 | Ht 62.0 in | Wt 142.0 lb

## 2016-06-05 DIAGNOSIS — G4700 Insomnia, unspecified: Secondary | ICD-10-CM

## 2016-06-05 DIAGNOSIS — G43719 Chronic migraine without aura, intractable, without status migrainosus: Secondary | ICD-10-CM

## 2016-06-05 NOTE — Progress Notes (Signed)
[facility] Follow[facility] Visit    History of Present Illness:  Jasmine Nunez is a 46 y.o. female  who presents here at the [facility] for follow-up of chronic migraine without aura and insomnia.     She was last seen here on 03/27/16 for a nerve block and 03/13/16 for f/u.      Since the last visit, she did have good relief for 2 days since her head was numb.  Then after that, she had severe migraines for 4 to 5 days.  These were debilitating migraines.    In January and February, she had migraines that came and went as opposed to constant migraines. These worsen during ovulation and may be worse during the week. She has migraines     She has tried an progesterone cream on her wrist constantly to see if this helps even out her hormones.  She had a 2 week where she was migraine free.      She is having about 6 migraines per month on average.  She has migraine pain everywhere with bodyaches, a feeling of confusion. She has pain behind her eyes and the left side of her head and back of her head.    MIDAS 25       0-5: little or no disability  6-10: mild disability  11-20: moderate disability  21+: severe disability      Current acute medications: naratriptan, naproxen  Current preventive medications: Mg citrate      Sleep: pretty good, she only has one to 2 nights where she cannot sleep at all during the month, then she will have bad migraines after this time  Exercise:  Not lately, normally, twice a week  Fluid intake: drinks 4 glasses of water a day  Caffeine intake: one a day      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    Fibromyalgia M79.7    GERD (gastroesophageal reflux disease) K21.9    Insomnia G47.00    Dense breast R92.2         Current Outpatient Prescriptions    Medication Sig    Magnesium Citrate POWD By no specified route    metroNIDAZOLE (METROGEL) 0.75 % gel Apply topically 2 times daily    fluticasone (FLONASE) 50 MCG/ACT nasal spray INSTILL 2 SPRAYS IN EACH NOSTRIL EVERY DAY    Non-System Medication Topical progesterone with rose hip oil    naratriptan (AMERGE) 2.5 MG tablet Take 1 tablet (2.5 mg total) by mouth as needed   May repeat once after 4 hours    naproxen (ALEVE) 220 MG capsule Take 440 mg by mouth daily as needed for Headaches    loratadine (CLARITIN) 10 MG tablet Take 10 mg by mouth daily as needed       Omega-3 Fatty Acids (FISH OIL) 1000 MG CAPS Take 1 capsule by mouth daily    Multiple Vitamin tablet Take 1 tablet by mouth daily       epinephrine (EPIPEN) 0.3 MG/0.3ML DEVI Inject 0.3 mg into the muscle once as needed        cholecalciferol (VITAMIN D) 1000 UNIT tablet Take 1,000 Units by mouth daily            The social, surgical history,  family history were reviewed with the patient and/or caregiver. I documented/updated these history elements and marked them as reviewed in the appropriate sections of the electronic health record.    ROS: 13 systems were reviewed via ROS questionnaire. Please see scanned questionnaire in the EHR media tab.      Neurologic exam:  Well appearing, in no distress. Attention was intact, language fluent with no aphasia.  Cognition intact. VA J5/J3 without correction.    Pupils reacted from 3/3 to 2/2. Fundi were sharp and flat without papilledema.  Versions full, VFFTC. No facial asymmetry or weakness.  Palate elevated symmetrically, tongue was midline. No pronator drift.  No postural tremor.  Finger tapping and HTS intact with no dysmetria.  Able to rise from a seated position without using arms.  Able to do a deep knee bend.  Normal heel, toe, and tandem gait.  Romberg negative.  Normal casual gait.       Impression/Plan:      ICD-10-CM ICD-9-CM   1. Chronic migraine without aura, intractable, without status  migrainosus G43.719 346.71   2. Insomnia, unspecified type G47.00 780.52     We did discuss CBT today as she is not interested in making medication changes.  I gave her names of people that treat with [place]BT in [place] that she can contact.    Medications that she has not yet tried- duloxetine, venlafaxine, verapamil, candesartan.  She also got Cephaly teaching today as another treatment option.    She has tried the following medications with the associated response, no relief, unless otherwise specified:  Amitriptyline- caused depression/affected her mood  Fluoxetine- insomnia  Gabapentin- felt drunk  topiramate- cognitive slowing  Propranolol- no relief    Sumatriptan  Eletriptan  Rizatriptan  Naproxen  Hydrocodone-acetaminophen    Neuroradiology: 11/13 MRI brain with contrast I personally reviewed and was normal.    She will be moving to [place] soon so I have not set up a f/u for her but am happy to see her should the need arise.    Thank you for referring your patient.    Please call with any questions or concerns.    Arthor Captain MD, MPH  [facility]  [phone]

## 2016-06-11 ENCOUNTER — Ambulatory Visit
Admission: RE | Admit: 2016-06-11 | Discharge: 2016-06-11 | Disposition: A | Discharge status: Home / Self Care | Payer: 59 | Source: Ambulatory Visit | Attending: Primary Care | Admitting: Primary Care

## 2016-06-11 DIAGNOSIS — Z1239 Encounter for other screening for malignant neoplasm of breast: Secondary | ICD-10-CM

## 2016-06-11 DIAGNOSIS — Z1231 Encounter for screening mammogram for malignant neoplasm of breast: Secondary | ICD-10-CM | POA: Insufficient documentation

## 2016-06-11 LAB — HM MAMMOGRAPHY

## 2016-06-19 ENCOUNTER — Other Ambulatory Visit: Payer: Self-pay | Admitting: Neurology

## 2016-06-19 DIAGNOSIS — G43009 Migraine without aura, not intractable, without status migrainosus: Secondary | ICD-10-CM

## 2016-07-14 ENCOUNTER — Ambulatory Visit: Payer: Self-pay | Admitting: Neurology

## 2016-09-11 ENCOUNTER — Ambulatory Visit
Admission: EM | Admit: 2016-09-11 | Discharge: 2016-09-11 | Disposition: A | Discharge status: Home / Self Care | Payer: Managed Care, Other (non HMO) | Attending: Emergency Medicine | Admitting: Emergency Medicine

## 2016-09-11 DIAGNOSIS — J0110 Acute frontal sinusitis, unspecified: Secondary | ICD-10-CM

## 2016-09-11 DIAGNOSIS — J0100 Acute maxillary sinusitis, unspecified: Secondary | ICD-10-CM

## 2016-09-11 MED ORDER — AMOXICILLIN-POT CLAVULANATE 875-125 MG PO TABS: 1.0000 | ORAL_TABLET | Freq: Two times a day (BID) | ORAL | 0 refills | Status: DC

## 2016-09-11 NOTE — ED Triage Notes (Signed)
Patient complains of sinus pain and pressure, cough and congestion. Patient states that symptoms started 1.5 weeks ago.

## 2016-09-11 NOTE — ED Provider Notes (Signed)
[facility] ____________________________________________  Time seen: Approximately 12:03 PM  I have reviewed the triage vital signs and the nursing notes.   HISTORY  Chief Complaint Facial Pain   HPI Cynthia Pittman is a 45 y.o. female  presenting for evaluation 1.5 weeks of runny nose, nasal congestion, sinus pressure and postnasal drainage. Reports intermittent cough, states cough is mostly at night with associated postnasal drainage. Reports frequently blowing her nose and getting very thick yellowish greenish drainage out. Reports symptoms unresolved with over-the-counter Claritin and NyQuil. Reports some chills and bodyaches, denies any fevers. States that she believes that this was started with a viral infection as her kids initially were also sick but doing better now. Reports does have some seasonal allergy history, this is not consistent with her seasonal allergies per patient. Reports intermittent sore throat denies current sore throat. Reports overall continues to eat and drink well. Denies aggravating or alleviating factors. Reports over-the-counter medication hasn't really helped.    Denies chest pain, shortness of breath, abdominal pain, dysuria, or rash. Denies recent sickness. Denies recent antibiotic use.   Patient's last menstrual period was 08/17/2016.: Denies pregnancy States just recently moved to the area     History reviewed. No pertinent past medical history.  There are no active problems to display for this patient.   Past Surgical History:  Procedure Laterality Date  . IUD REMOVAL    . KNEE SURGERY Right      No current facility-administered medications for this encounter.   Current Outpatient Prescriptions:  .  fluticasone (FLONASE) 50 MCG/ACT nasal spray, Place into both nostrils daily., Disp: , Rfl:  .  lactobacillus acidophilus (BACID) TABS tablet, Take 2 tablets by mouth 3 (three) times daily., Disp: , Rfl:  .  loratadine (CLARITIN) 10 MG  tablet, Take 10 mg by mouth daily., Disp: , Rfl:  .  Multiple Vitamins-Minerals (MULTIVITAMIN WITH MINERALS) tablet, Take 1 tablet by mouth daily., Disp: , Rfl:  .  naratriptan (AMERGE) 1 MG TABS tablet, Take 1 mg by mouth once as needed. Take one (1) tablet at onset of headache; if returns or does not resolve, may repeat after 4 hours; do not exceed five (5) mg in 24 hours., Disp: , Rfl:  .  Omega-3 Fatty Acids (FISH OIL) 1000 MG CAPS, Take by mouth., Disp: , Rfl:  .  amoxicillin-clavulanate (AUGMENTIN) 875-125 MG tablet, Take 1 tablet by mouth every 12 (twelve) hours., Disp: 20 tablet, Rfl: 0  Allergies Percocet [oxycodone-acetaminophen] and Watermelon [citrullus vulgaris]  History reviewed. No pertinent family history.  Social History Social History  Substance Use Topics  . Smoking status: Never Smoker  . Smokeless tobacco: Never Used  . Alcohol use Yes     Comment: occasionally    Review of Systems Constitutional: As above.  Eyes: No visual changes. ENT:As above.  Cardiovascular: Denies chest pain. Respiratory: Denies shortness of breath. Gastrointestinal: No abdominal pain.   Genitourinary: Negative for dysuria. Musculoskeletal: Negative for back pain. Skin: Negative for rash.  ____________________________________________   PHYSICAL EXAM:  VITAL SIGNS: ED Triage Vitals  Enc Vitals Group     BP 09/11/16 1145 108/75     Pulse Rate 09/11/16 1145 93     Resp 09/11/16 1145 18     Temp 09/11/16 1145 98.2 F (36.8 C)     Temp Source 09/11/16 1145 Oral     SpO2 09/11/16 1145 97 %     Weight 09/11/16 1141 140 lb (63.5 kg)     Height  09/11/16 1141 5\' 2"  (1.575 m)     Head Circumference --      Peak Flow --      Pain Score 09/11/16 1141 6     Pain Loc --      Pain Edu? --      Excl. in GC? --     Constitutional: Alert and oriented. Well appearing and in no acute distress. Eyes: Conjunctivae are normal. PERRL. EOMI. Head: Atraumatic.Mild to moderate tenderness to  palpation bilateral frontal and maxillary sinuses. No swelling. No erythema.   Ears: no erythema, normal TMs bilaterally.   Nose: nasal congestion with bilateral nasal turbinate erythema and edema.   Mouth/Throat: Mucous membranes are moist.  Oropharynx non-erythematous.No tonsillar swelling or exudate.  Neck: No stridor.  No cervical spine tenderness to palpation. Hematological/Lymphatic/Immunilogical: No cervical lymphadenopathy. Cardiovascular: Normal rate, regular rhythm. Grossly normal heart sounds.  Good peripheral circulation. Respiratory: Normal respiratory effort.  No retractions.  No wheezes, rales or rhonchi. Good air movement.  Musculoskeletal: No cervical, thoracic or lumbar tenderness to palpation.  Neurologic:  Normal speech and language. No gait instability. Skin:  Skin is warm, dry. Psychiatric: Mood and affect are normal. Speech and behavior are normal.  ___________________________________________   LABS (all labs ordered are listed, but only abnormal results are displayed)  Labs Reviewed - No data to display ____________________________________________   PROCEDURES Procedures   INITIAL IMPRESSION / ASSESSMENT AND PLAN / ED COURSE  Pertinent labs & imaging results that were available during my care of the patient were reviewed by me and considered in my medical decision making (see chart for details).  Laboratory patient. No acute distress. Suspect sinusitis post viral upper respiratory infection. Encouraged rest, fluids, supportive care, over-the-counter Sudafed, continue Claritin and will start patient on oral Augmentin. Discussed indication, risks and benefits of medications with patient.  Discussed follow up with Primary care physician this week. Discussed follow up and return parameters including no resolution or any worsening concerns. Patient verbalized understanding and agreed to plan.   ____________________________________________   FINAL CLINICAL  IMPRESSION(S) / ED DIAGNOSES  Final diagnoses:  Acute maxillary sinusitis, recurrence not specified  Acute frontal sinusitis, recurrence not specified     Discharge Medication List as of 09/11/2016 12:11 PM    START taking these medications   Details  amoxicillin-clavulanate (AUGMENTIN) 875-125 MG tablet Take 1 tablet by mouth every 12 (twelve) hours., Starting Thu 09/11/2016, Normal        Note: This dictation was prepared with Dragon dictation along with smaller phrase technology. Any transcriptional errors that result from this process are unintentional.         Renford DillsMiller, Arnisha Laffoon, NP 09/11/16 1327

## 2016-09-11 NOTE — Discharge Instructions (Signed)
Take medication as prescribed. Rest. Drink plenty of fluids.  ° °Follow up with your primary care physician this week as needed. Return to Urgent care for new or worsening concerns.  ° °

## 2016-09-29 ENCOUNTER — Telehealth: Payer: Self-pay | Admitting: Primary Care

## 2016-09-29 ENCOUNTER — Other Ambulatory Visit: Payer: Self-pay | Admitting: Family Medicine

## 2016-09-29 MED ORDER — FLUCONAZOLE 150 MG PO TABS *I*
150.0000 mg | ORAL_TABLET | Freq: Once | ORAL | 0 refills | Status: AC
Start: 2016-09-29 — End: 2016-09-29

## 2016-09-29 NOTE — Telephone Encounter (Signed)
Diflucan sent to pharmacy.

## 2016-09-29 NOTE — Telephone Encounter (Signed)
I spoke with the patient . [place]. She was recently treated for a sinus infection at an UC there. She now has a yeast infection. She denies dysuria . She is having vaginal itching.She has not established with new PCP yet. She is asking for diflucan to be sent to pharmacy listed below.  Cue'd for approval by covering provider     [facility] [place]  [place] [phone]

## 2016-09-29 NOTE — Telephone Encounter (Signed)
I called the patient to advise script was sent

## 2016-09-29 NOTE — Telephone Encounter (Addendum)
Patient feels like she has a yeast infection and was wondering if we can send that in for her  Itchy, she was on antibiotic about 2 weeks ago and feels like she got this right after  [facility] [place] [address] [place]  They just moved there and she does not have a doctor yet

## 2016-10-15 ENCOUNTER — Other Ambulatory Visit: Payer: Self-pay | Admitting: Neurology

## 2016-10-15 DIAGNOSIS — G43009 Migraine without aura, not intractable, without status migrainosus: Secondary | ICD-10-CM

## 2016-10-15 NOTE — Telephone Encounter (Signed)
[facility] [place]. My[place] sent to pt [place] her [place].

## 2016-10-16 NOTE — Telephone Encounter (Signed)
WIll route to clerical to call patient and ask her if she needs us to refill her medication or not. According to the last office note, patient was moving, but we received this request and wanted to check.

## 2016-10-27 ENCOUNTER — Telehealth: Payer: Self-pay | Admitting: Primary Care

## 2016-10-27 NOTE — Telephone Encounter (Signed)
Spoke with patient.  Told her to go to UC to r/o BV versus yeast.

## 2016-10-27 NOTE — Telephone Encounter (Signed)
Left message for patient to return my call.

## 2016-10-27 NOTE — Telephone Encounter (Signed)
Jasmine Nunez called she said she has not seen her new doctor yet and she thinks she still has  A yeast infection from last month wondering what she should do    [phone]

## 2016-10-27 NOTE — Telephone Encounter (Signed)
Agree 

## 2016-12-10 ENCOUNTER — Other Ambulatory Visit: Payer: Self-pay | Admitting: Neurology

## 2016-12-10 DIAGNOSIS — G43009 Migraine without aura, not intractable, without status migrainosus: Secondary | ICD-10-CM

## 2016-12-10 NOTE — Telephone Encounter (Signed)
Auto refill request received for Naratriptan.  [place]  Mychart sent to pt asking if she has established care there.  Per pt, she is all set and refill request was sent in error.  Request refused.  Will sign and close encounter.

## 2016-12-10 NOTE — Telephone Encounter (Signed)
Auto refill request received for pt's Naratriptan.  Per chart[place] and was working on establishing care with a PCP and Neurologist there.  Mychart sent to pt asking her if she had been seen by anyone yet.  Will wait to hear back from pt.

## 2017-07-21 ENCOUNTER — Other Ambulatory Visit: Payer: Self-pay | Admitting: Obstetrics and Gynecology

## 2017-07-21 DIAGNOSIS — Z1231 Encounter for screening mammogram for malignant neoplasm of breast: Secondary | ICD-10-CM

## 2017-07-30 ENCOUNTER — Encounter (INDEPENDENT_AMBULATORY_CARE_PROVIDER_SITE_OTHER): Payer: Self-pay

## 2017-07-30 ENCOUNTER — Ambulatory Visit
Admission: RE | Admit: 2017-07-30 | Discharge: 2017-07-30 | Disposition: A | Discharge status: Home / Self Care | Payer: Managed Care, Other (non HMO) | Source: Ambulatory Visit | Attending: Obstetrics and Gynecology | Admitting: Obstetrics and Gynecology

## 2017-07-30 DIAGNOSIS — Z1231 Encounter for screening mammogram for malignant neoplasm of breast: Secondary | ICD-10-CM | POA: Insufficient documentation

## 2017-08-10 ENCOUNTER — Other Ambulatory Visit: Payer: Self-pay | Admitting: *Deleted

## 2017-08-10 ENCOUNTER — Inpatient Hospital Stay
Admission: RE | Admit: 2017-08-10 | Discharge: 2017-08-10 | Disposition: A | Discharge status: Home / Self Care | Payer: Self-pay | Source: Ambulatory Visit | Attending: *Deleted | Admitting: *Deleted

## 2017-08-10 DIAGNOSIS — Z9289 Personal history of other medical treatment: Secondary | ICD-10-CM

## 2018-09-07 ENCOUNTER — Other Ambulatory Visit: Payer: Self-pay | Admitting: Certified Nurse Midwife

## 2018-09-07 ENCOUNTER — Other Ambulatory Visit: Payer: Self-pay | Admitting: Obstetrics and Gynecology

## 2018-09-07 DIAGNOSIS — Z1231 Encounter for screening mammogram for malignant neoplasm of breast: Secondary | ICD-10-CM

## 2018-10-06 ENCOUNTER — Inpatient Hospital Stay: Admission: RE | Admit: 2018-10-06 | Payer: Managed Care, Other (non HMO) | Source: Ambulatory Visit

## 2018-11-10 ENCOUNTER — Other Ambulatory Visit: Payer: Self-pay

## 2018-11-10 ENCOUNTER — Ambulatory Visit
Admission: RE | Admit: 2018-11-10 | Discharge: 2018-11-10 | Disposition: A | Discharge status: Home / Self Care | Payer: Managed Care, Other (non HMO) | Source: Ambulatory Visit | Attending: Certified Nurse Midwife | Admitting: Certified Nurse Midwife

## 2018-11-10 DIAGNOSIS — Z1231 Encounter for screening mammogram for malignant neoplasm of breast: Secondary | ICD-10-CM | POA: Diagnosis present

## 2019-07-18 ENCOUNTER — Telehealth: Payer: Self-pay

## 2019-07-18 NOTE — Telephone Encounter (Signed)
Care Coordination Outreach  by Data Analyst     Select F2 to Complete Form     LEFT MESSAGE LEFT MESSAGE WITH PATIENT   Topic: PRIMARY CARE CONFIRMATION   Outcome: LEFT MESSAGE WITH PATIENT TODAY    Has not been seen since 2017    Please call our office to make an appointment at your earliest convenience. This may be scheduled as a complete physical if you have no active health concerns, or as a routine follow up visit if you have health conditions to discuss.       If you are no longer a patient at our practice, please let us know so that we may update our records.         Is the outreach for a Metric? YES      By: Eustaquio Boyden   Date: 07/18/19

## 2019-10-06 ENCOUNTER — Other Ambulatory Visit: Payer: Self-pay | Admitting: Pediatrics

## 2019-10-06 ENCOUNTER — Other Ambulatory Visit: Payer: Self-pay | Admitting: Certified Nurse Midwife

## 2019-10-06 DIAGNOSIS — Z1231 Encounter for screening mammogram for malignant neoplasm of breast: Secondary | ICD-10-CM

## 2019-11-17 ENCOUNTER — Ambulatory Visit
Admission: RE | Admit: 2019-11-17 | Discharge: 2019-11-17 | Disposition: A | Discharge status: Home / Self Care | Payer: Managed Care, Other (non HMO) | Source: Ambulatory Visit | Attending: Pediatrics | Admitting: Pediatrics

## 2019-11-17 ENCOUNTER — Other Ambulatory Visit: Payer: Self-pay

## 2019-11-17 DIAGNOSIS — Z1231 Encounter for screening mammogram for malignant neoplasm of breast: Secondary | ICD-10-CM | POA: Diagnosis not present

## 2020-11-13 ENCOUNTER — Other Ambulatory Visit: Payer: Self-pay | Admitting: Student

## 2020-11-13 DIAGNOSIS — Z1231 Encounter for screening mammogram for malignant neoplasm of breast: Secondary | ICD-10-CM

## 2020-12-04 ENCOUNTER — Ambulatory Visit: Payer: Managed Care, Other (non HMO)

## 2020-12-20 ENCOUNTER — Other Ambulatory Visit: Payer: Self-pay

## 2020-12-20 ENCOUNTER — Ambulatory Visit
Admission: RE | Admit: 2020-12-20 | Discharge: 2020-12-20 | Disposition: A | Discharge status: Home / Self Care | Payer: Managed Care, Other (non HMO) | Source: Ambulatory Visit | Attending: Student | Admitting: Student

## 2020-12-20 DIAGNOSIS — Z1231 Encounter for screening mammogram for malignant neoplasm of breast: Secondary | ICD-10-CM | POA: Insufficient documentation

## 2021-09-17 ENCOUNTER — Other Ambulatory Visit: Payer: Self-pay | Admitting: Student

## 2021-09-17 DIAGNOSIS — Z1231 Encounter for screening mammogram for malignant neoplasm of breast: Secondary | ICD-10-CM

## 2021-12-23 ENCOUNTER — Ambulatory Visit
Admission: RE | Admit: 2021-12-23 | Discharge: 2021-12-23 | Disposition: A | Discharge status: Home / Self Care | Payer: Managed Care, Other (non HMO) | Source: Ambulatory Visit | Attending: Student | Admitting: Student

## 2021-12-23 DIAGNOSIS — Z1231 Encounter for screening mammogram for malignant neoplasm of breast: Secondary | ICD-10-CM | POA: Insufficient documentation

## 2022-04-20 IMAGING — MG MM DIGITAL SCREENING BILAT W/ TOMO AND CAD
8 series · 8 of 24 positions shown · non-contrast
Comparison: Previous exam(s).

CLINICAL DATA: Screening.

EXAM:
DIGITAL SCREENING BILATERAL MAMMOGRAM WITH TOMOSYNTHESIS AND CAD
TECHNIQUE: Bilateral screening digital craniocaudal and mediolateral oblique
mammograms were obtained. Bilateral screening digital breast
tomosynthesis was performed. The images were evaluated with
computer-aided detection.

[R CC synth-2D]
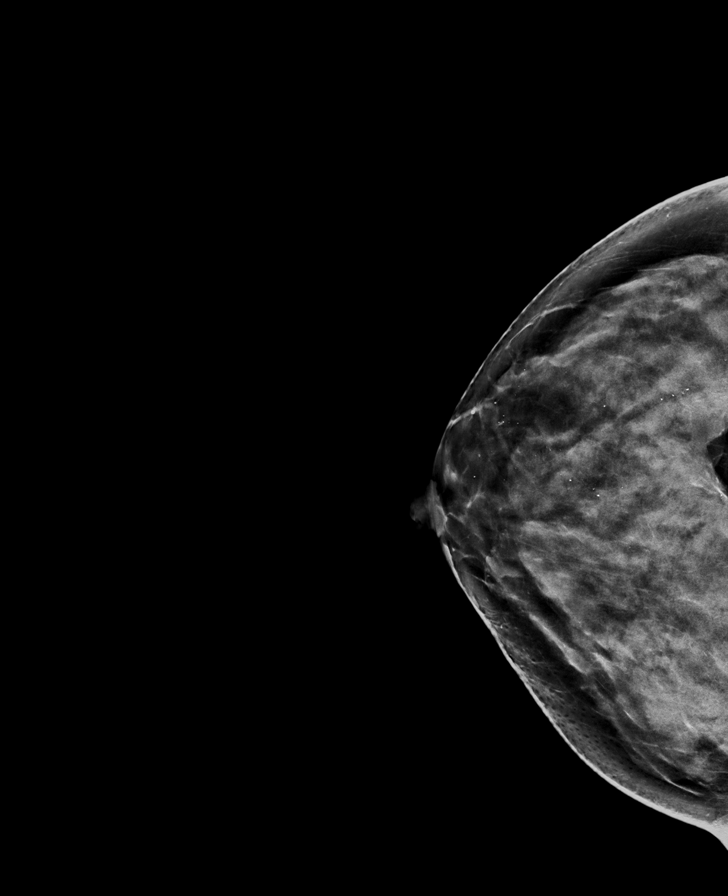

[R MLO synth-2D]
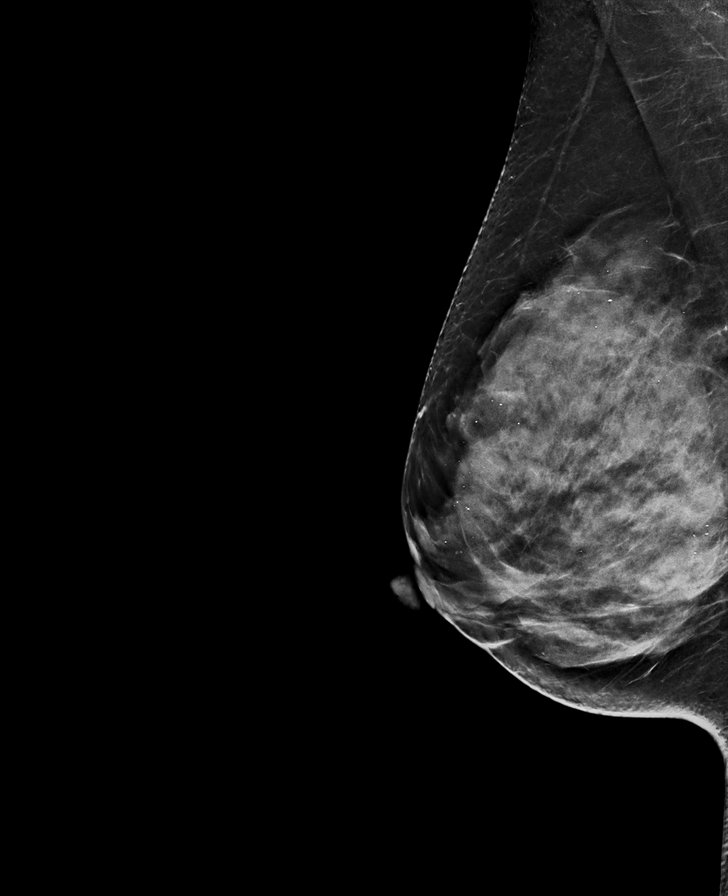

[L MLO synth-2D]
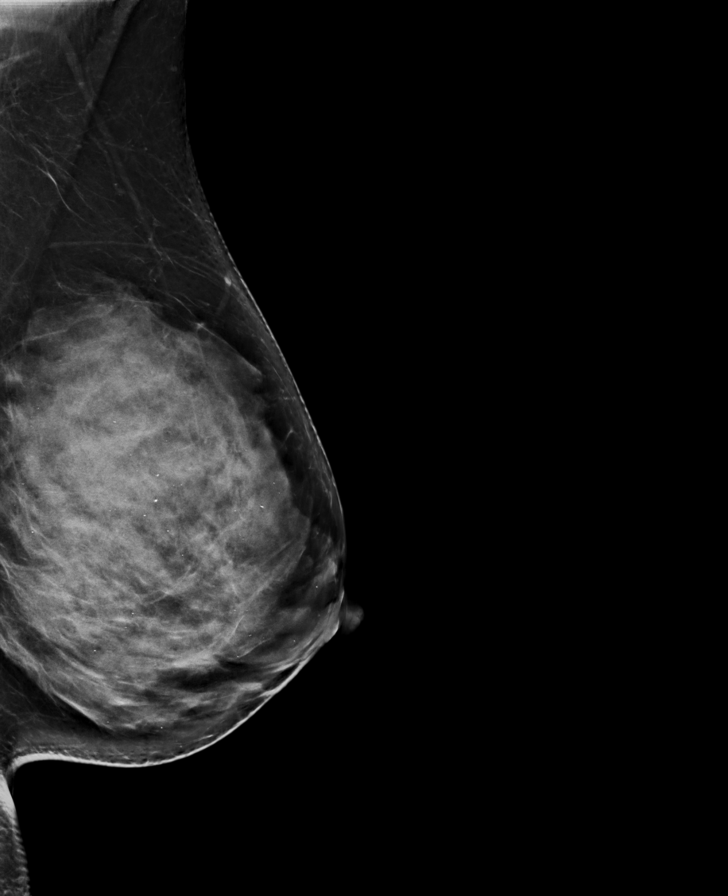

[L CC synth-2D]
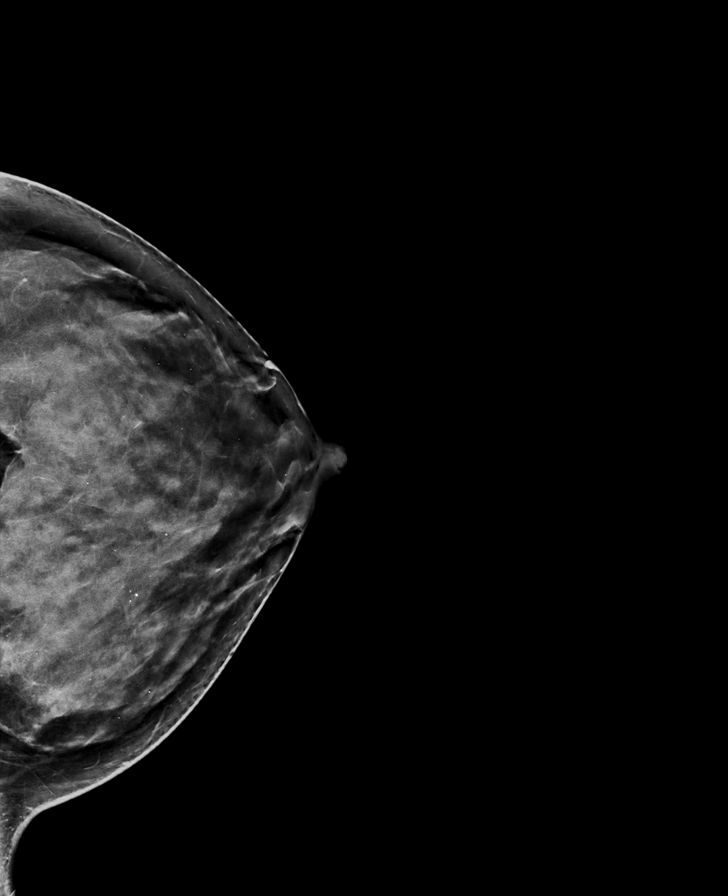

[R CC tomo · tomo slice 37/73.0]
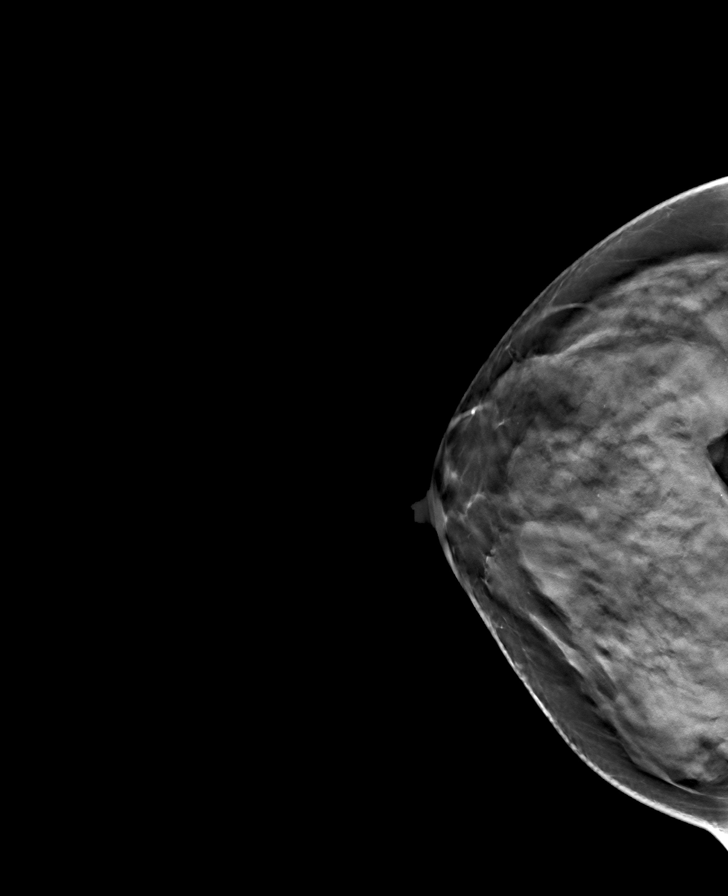

[L MLO tomo · tomo slice 39/77.0]
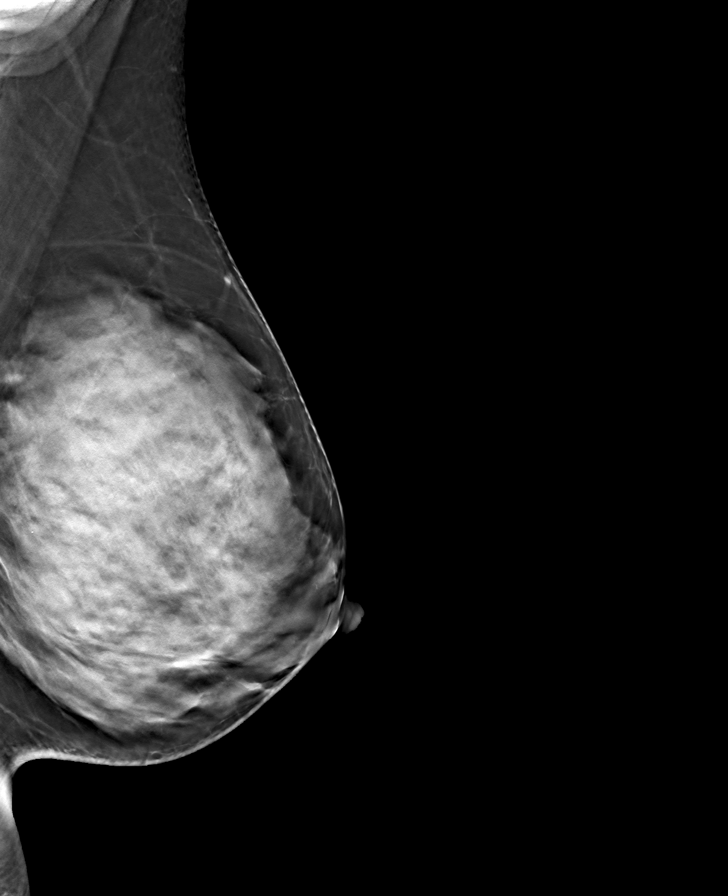

[L CC tomo · tomo slice 37/74.0]
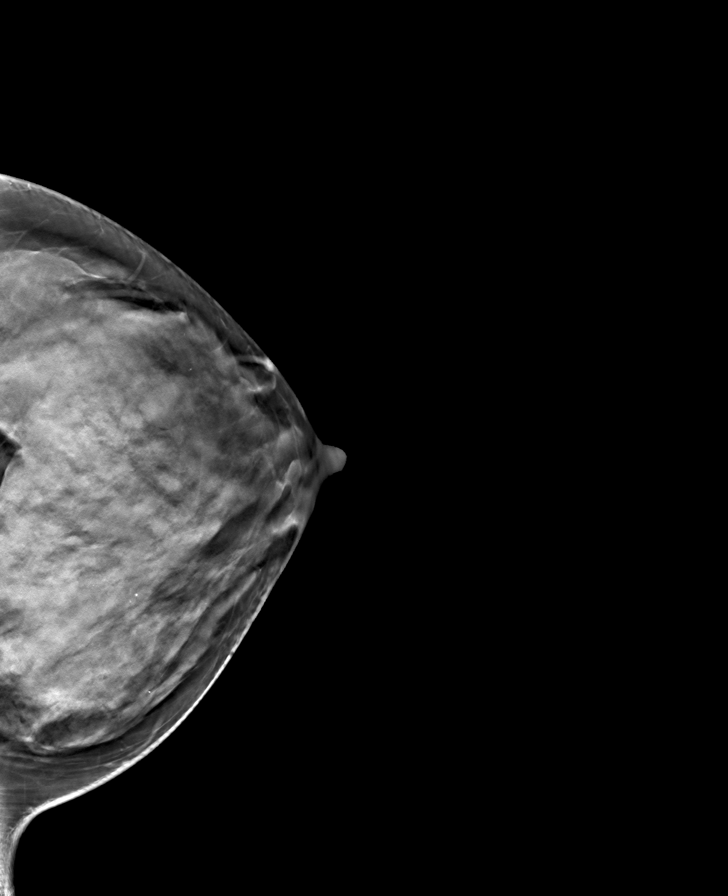

[R MLO tomo · tomo slice 36/71.0]
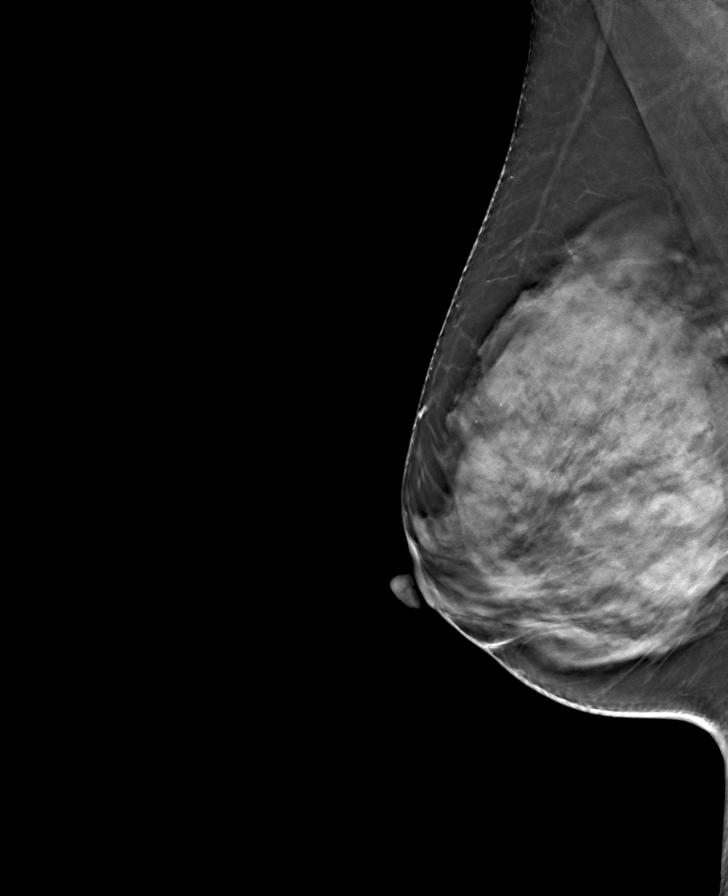

[8 of 24 positions shown; findings below may reference images not displayed]

ACR Breast Density Category d: The breast tissue is extremely dense,
which lowers the sensitivity of mammography
FINDINGS: There are no findings suspicious for malignancy.
IMPRESSION: No mammographic evidence of malignancy. A result letter of this
screening mammogram will be mailed directly to the patient.

RECOMMENDATION:
Screening mammogram in one year. (Code:TA-V-WV9)

BI-RADS CATEGORY  1: Negative.

## 2022-08-15 ENCOUNTER — Other Ambulatory Visit: Payer: Self-pay | Admitting: Student

## 2022-08-15 DIAGNOSIS — Z1231 Encounter for screening mammogram for malignant neoplasm of breast: Secondary | ICD-10-CM

## 2022-12-25 ENCOUNTER — Ambulatory Visit
Admission: RE | Admit: 2022-12-25 | Discharge: 2022-12-25 | Disposition: A | Discharge status: Home / Self Care | Payer: Managed Care, Other (non HMO) | Source: Ambulatory Visit | Attending: Student | Admitting: Student

## 2022-12-25 DIAGNOSIS — Z1231 Encounter for screening mammogram for malignant neoplasm of breast: Secondary | ICD-10-CM | POA: Diagnosis present

## 2023-06-01 ENCOUNTER — Other Ambulatory Visit: Payer: Self-pay

## 2023-06-01 NOTE — Progress Notes (Signed)
 Care Coordination  by Data Coordinator     Select F2 to Complete Form     Spoke with: OUTREACH LETTER MAILED TO PATIENT    Topic: Physical   Outcome: OUTREACH LETTER MAILED TO PATIENT RE: NEED FOR FUV/PHYSICAL. LAST SEEN IN 2017      Is the outreach for a Metric? Yes      By: Eustaquio Boyden   Date: 06/01/23

## 2023-08-29 ENCOUNTER — Ambulatory Visit
Admission: EM | Admit: 2023-08-29 | Discharge: 2023-08-29 | Disposition: A | Discharge status: Home / Self Care | Attending: Family Medicine | Admitting: Family Medicine

## 2023-08-29 ENCOUNTER — Encounter: Payer: Self-pay | Admitting: Emergency Medicine

## 2023-08-29 DIAGNOSIS — N898 Other specified noninflammatory disorders of vagina: Secondary | ICD-10-CM | POA: Diagnosis present

## 2023-08-29 DIAGNOSIS — R300 Dysuria: Secondary | ICD-10-CM | POA: Diagnosis present

## 2023-08-29 DIAGNOSIS — Z202 Contact with and (suspected) exposure to infections with a predominantly sexual mode of transmission: Secondary | ICD-10-CM | POA: Diagnosis present

## 2023-08-29 LAB — URINALYSIS, W/ REFLEX TO CULTURE (INFECTION SUSPECTED)
Bilirubin Urine: NEGATIVE
Glucose, UA: NEGATIVE mg/dL
Hgb urine dipstick: NEGATIVE
Ketones, ur: NEGATIVE mg/dL
Nitrite: NEGATIVE
Protein, ur: NEGATIVE mg/dL
Specific Gravity, Urine: 1.01 (ref 1.005–1.030)
pH: 5.5 (ref 5.0–8.0)

## 2023-08-29 MED ORDER — METRONIDAZOLE 500 MG PO TABS: 500.0000 mg | ORAL_TABLET | Freq: Two times a day (BID) | ORAL | 0 refills | Status: AC

## 2023-08-29 MED ORDER — NITROFURANTOIN MONOHYD MACRO 100 MG PO CAPS: 100.0000 mg | ORAL_CAPSULE | Freq: Two times a day (BID) | ORAL | 0 refills | Status: AC

## 2023-08-29 NOTE — ED Provider Notes (Signed)
 [facility]    CSN: [id number] Arrival date & time: 08/29/23  1254      History   Chief Complaint Chief Complaint  Patient presents with   Dysuria     HPI HPI Cynthia Pittman is a 53 y.o. female.    Cynthia Pittman presents for dysuria that started yesterday.  She has slight lower back pain that started a week ago.  Notes some increased vaginal discharge even though she is going through menopause.  Denies any vaginal bleeding.  She has had unprotected sex with a new partner recently.  Denies vaginal odor, nausea, vomiting, abdominal pain or fever.  Has been no new rash or skin lesions.    History reviewed. No pertinent past medical history.  There are no active problems to display for this patient.   Past Surgical History:  Procedure Laterality Date   IUD REMOVAL     KNEE SURGERY Right     OB History   No obstetric history on file.      Home Medications    Prior to Admission medications   Medication Sig Start Date End Date Taking? Authorizing Provider  metroNIDAZOLE (FLAGYL) 500 MG tablet Take 1 tablet (500 mg total) by mouth 2 (two) times daily. 08/29/23  Yes Maimuna Leaman, DO  naratriptan (AMERGE) 1 MG TABS tablet Take 1 mg by mouth once as needed. Take one (1) tablet at onset of headache; if returns or does not resolve, may repeat after 4 hours; do not exceed five (5) mg in 24 hours.   Yes [provider]  nitrofurantoin, macrocrystal-monohydrate, (MACROBID) 100 MG capsule Take 1 capsule (100 mg total) by mouth 2 (two) times daily. 08/29/23  Yes Sorayah Schrodt, DO  fluticasone (FLONASE) 50 MCG/ACT nasal spray Place into both nostrils daily.    [provider]  lactobacillus acidophilus (BACID) TABS tablet Take 2 tablets by mouth 3 (three) times daily.    [provider]  loratadine (CLARITIN) 10 MG tablet Take 10 mg by mouth daily.    [provider]  Multiple Vitamins-Minerals (MULTIVITAMIN WITH MINERALS) tablet Take 1  tablet by mouth daily.    [provider]  Omega-3 Fatty Acids (FISH OIL) 1000 MG CAPS Take by mouth.    [provider]    Family History Family History  Problem Relation Age of Onset   Breast cancer Maternal Grandmother 52   Breast cancer Cousin 26       mat cousin    Social History Social History   Tobacco Use   Smoking status: Never   Smokeless tobacco: Never  Vaping Use   Vaping status: Never Used  Substance Use Topics   Alcohol use: Yes    Comment: occasionally   Drug use: No     Allergies   Percocet [oxycodone-acetaminophen] and Watermelon [citrullus vulgaris]   Review of Systems Review of Systems: :negative unless otherwise stated in HPI.      Physical Exam Triage Vital Signs ED Triage Vitals  Encounter Vitals Group     BP 08/29/23 1306 133/84     Systolic BP Percentile --      Diastolic BP Percentile --      Pulse Rate 08/29/23 1306 84     Resp 08/29/23 1306 14     Temp 08/29/23 1306 98.3 F (36.8 C)     Temp Source 08/29/23 1306 Oral     SpO2 08/29/23 1306 97 %     Weight 08/29/23 1304 139 lb 15.9 oz (  63.5 kg)     Height 08/29/23 1304 5' 2.5" (1.588 m)     Head Circumference --      Peak Flow --      Pain Score 08/29/23 1304 5     Pain Loc --      Pain Education --      Exclude from Growth Chart --    No data found.  Updated Vital Signs BP 133/84 (BP Location: Left Arm)   Pulse 84   Temp 98.3 F (36.8 C) (Oral)   Resp 14   Ht 5' 2.5" (1.588 m)   Wt 63.5 kg   SpO2 97%   BMI 25.20 kg/m   Visual Acuity Right Eye Distance:   Left Eye Distance:   Bilateral Distance:    Right Eye Near:   Left Eye Near:    Bilateral Near:     Physical Exam GEN: well appearing female in no acute distress  CVS: well perfused  RESP: speaking in full sentences without pause  GU: deferred, patient performed self swab    UC Treatments / Results  Labs (all labs ordered are listed, but only abnormal results are displayed) Labs  Reviewed  URINALYSIS, W/ REFLEX TO CULTURE (INFECTION SUSPECTED) - Abnormal; Notable for the following components:      Result Value   Leukocytes,Ua MODERATE (*)    Non Squamous Epithelial PRESENT (*)    Bacteria, UA FEW (*)    All other components within normal limits  URINE CULTURE  CERVICOVAGINAL ANCILLARY ONLY  CERVICOVAGINAL ANCILLARY ONLY    EKG   Radiology No results found.  Procedures Procedures (including critical care time)  Medications Ordered in UC Medications - No data to display  Initial Impression / Assessment and Plan / UC Course  I have reviewed the triage vital signs and the nursing notes.  Pertinent labs & imaging results that were available during my care of the patient were reviewed by me and considered in my medical decision making (see chart for details).      Patient is a 53 y.o.Cynthia Pittman female  who presents for dysuria with vaginal discharge after having unprotected sex with a new partner.  Overall patient is well-appearing and afebrile.  Vital signs stable.  UA consistent with possible acute cystitis.   Hematuria not supported on microscopy.  Treat with possible UTI with Macrobid 2 times daily for 5 days. Urine culture obtained.  Follow-up sensitivities and stop or change antibiotics, if needed.  Vaginal swab for yeast vaginitis and bacterial vaginitis, trichomonas, gonorrhea and chlamydia obtained.  Patient declined prophylactic treatment for STDs. - Treatment:Macrobid twice daily for 5 days.  Flagyl 500 BID x 7 days and advised patient to not drink alcohol while taking this medication.   Return precautions including abdominal pain, fever, chills, nausea, or vomiting given. Discussed MDM, treatment plan and plan for follow-up with patient who agrees with plan.     Final Clinical Impressions(s) / UC Diagnoses   Final diagnoses:  Dysuria  Possible exposure to STD  Vaginal discharge     Discharge Instructions      For vaginal discharge: We will  start treatment for possible bacterial vaginosis with metronidazole/Flagyl.  Do not drink alcohol while taking this medication  For possible UTI: We will start treatment with nitrofurantoin/Macrobid.    Your STD test results will be available in the next 72 hours. If positive, someone will contact you.  You should see your results in your MyChart account.  ED Prescriptions     Medication Sig Dispense Auth. Provider   nitrofurantoin, macrocrystal-monohydrate, (MACROBID) 100 MG capsule Take 1 capsule (100 mg total) by mouth 2 (two) times daily. 10 capsule Petrea Fredenburg, DO   metroNIDAZOLE (FLAGYL) 500 MG tablet Take 1 tablet (500 mg total) by mouth 2 (two) times daily. 14 tablet Brelan Hannen, DO      PDMP not reviewed this encounter.   Milburn Freeney, DO 08/29/23 1359

## 2023-08-29 NOTE — ED Triage Notes (Signed)
 Patient c/o dysuria that started yesterday.  Patient denies fevers.

## 2023-08-29 NOTE — Discharge Instructions (Signed)
 For vaginal discharge: We will start treatment for possible bacterial vaginosis with metronidazole/Flagyl.  Do not drink alcohol while taking this medication  For possible UTI: We will start treatment with nitrofurantoin/Macrobid.    Your STD test results will be available in the next 72 hours. If positive, someone will contact you.  You should see your results in your MyChart account.

## 2023-08-30 LAB — URINE CULTURE: Culture: <10000 — AB

## 2023-08-31 ENCOUNTER — Ambulatory Visit (HOSPITAL_COMMUNITY): Payer: Self-pay

## 2023-08-31 LAB — CERVICOVAGINAL ANCILLARY ONLY
Bacterial Vaginitis (gardnerella): NEGATIVE
Candida Glabrata: NEGATIVE
Candida Vaginitis: POSITIVE — AB
Chlamydia: NEGATIVE
Comment: NEGATIVE
Comment: NEGATIVE
Comment: NEGATIVE
Comment: NEGATIVE
Comment: NEGATIVE
Comment: NORMAL
Neisseria Gonorrhea: NEGATIVE
Trichomonas: NEGATIVE

## 2023-08-31 MED ORDER — FLUCONAZOLE 150 MG PO TABS: 150.0000 mg | ORAL_TABLET | Freq: Once | ORAL | 0 refills | Status: AC

## 7864-05-29 DEATH — deceased
